# Patient Record
Sex: Male | Born: 1960 | Race: White | Hispanic: No | Marital: Single | Smoking: Current every day smoker
Health system: Southern US, Community
[De-identification: ages and names within clinical notes are randomized; demographics above are authoritative.]

## PROBLEM LIST (undated history)

## (undated) DIAGNOSIS — J439 Emphysema, unspecified: Secondary | ICD-10-CM

## (undated) HISTORY — PX: TONSILLECTOMY AND ADENOIDECTOMY: SHX28

---

## 2008-10-14 ENCOUNTER — Emergency Department: Payer: Self-pay | Admitting: Emergency Medicine

## 2009-10-08 ENCOUNTER — Emergency Department: Payer: Self-pay | Admitting: Internal Medicine

## 2011-01-05 ENCOUNTER — Emergency Department: Payer: Self-pay | Admitting: *Deleted

## 2013-02-17 DIAGNOSIS — D126 Benign neoplasm of colon, unspecified: Secondary | ICD-10-CM | POA: Insufficient documentation

## 2013-09-28 ENCOUNTER — Emergency Department: Payer: Self-pay | Admitting: Emergency Medicine

## 2013-09-28 LAB — CBC WITH DIFFERENTIAL/PLATELET
BASOS PCT: 0.4 %
Basophil #: 0 10*3/uL (ref 0.0–0.1)
Eosinophil #: 0.1 10*3/uL (ref 0.0–0.7)
Eosinophil %: 0.9 %
HCT: 41.9 % (ref 40.0–52.0)
HGB: 13.6 g/dL (ref 13.0–18.0)
LYMPHS PCT: 17.8 %
Lymphocyte #: 2.3 10*3/uL (ref 1.0–3.6)
MCH: 30 pg (ref 26.0–34.0)
MCHC: 32.6 g/dL (ref 32.0–36.0)
MCV: 92 fL (ref 80–100)
Monocyte #: 1.6 x10 3/mm — ABNORMAL HIGH (ref 0.2–1.0)
Monocyte %: 12.5 %
NEUTROS PCT: 68.4 %
Neutrophil #: 9 10*3/uL — ABNORMAL HIGH (ref 1.4–6.5)
Platelet: 294 10*3/uL (ref 150–440)
RBC: 4.55 10*6/uL (ref 4.40–5.90)
RDW: 13 % (ref 11.5–14.5)
WBC: 13.2 10*3/uL — AB (ref 3.8–10.6)

## 2013-09-28 LAB — TROPONIN I: Troponin-I: <0.02 ng/mL

## 2013-09-28 LAB — BASIC METABOLIC PANEL
ANION GAP: 7 (ref 7–16)
BUN: 16 mg/dL (ref 7–18)
CREATININE: 0.87 mg/dL (ref 0.60–1.30)
Calcium, Total: 8.7 mg/dL (ref 8.5–10.1)
Chloride: 107 mmol/L (ref 98–107)
Co2: 26 mmol/L (ref 21–32)
EGFR (African American): >60
EGFR (Non-African Amer.): >60
Glucose: 87 mg/dL (ref 65–99)
Osmolality: 280 (ref 275–301)
Potassium: 3.5 mmol/L (ref 3.5–5.1)
SODIUM: 140 mmol/L (ref 136–145)

## 2015-01-10 ENCOUNTER — Emergency Department: Payer: Self-pay

## 2015-01-10 ENCOUNTER — Emergency Department
Admission: EM | Admit: 2015-01-10 | Discharge: 2015-01-10 | Disposition: A | Discharge status: Home / Self Care | Payer: Self-pay | Attending: Emergency Medicine | Admitting: Emergency Medicine

## 2015-01-10 ENCOUNTER — Encounter: Payer: Self-pay | Admitting: Emergency Medicine

## 2015-01-10 DIAGNOSIS — Z720 Tobacco use: Secondary | ICD-10-CM

## 2015-01-10 DIAGNOSIS — J159 Unspecified bacterial pneumonia: Secondary | ICD-10-CM | POA: Insufficient documentation

## 2015-01-10 DIAGNOSIS — J189 Pneumonia, unspecified organism: Secondary | ICD-10-CM

## 2015-01-10 MED ORDER — DM-GUAIFENESIN ER 30-600 MG PO TB12: 1.0000 | ORAL_TABLET | Freq: Two times a day (BID) | ORAL | Status: DC

## 2015-01-10 MED ORDER — AZITHROMYCIN 250 MG PO TABS: ORAL_TABLET | ORAL | Status: AC

## 2015-01-10 NOTE — ED Notes (Signed)
States he has had sinus congestion and cough for about 1 week   Fever and chills.  States he is coughing up greenish sputum.

## 2015-01-10 NOTE — Discharge Instructions (Signed)
Smoking Cessation, Tips for Success If you are ready to quit smoking, congratulations! You have chosen to help yourself be healthier. Cigarettes bring nicotine, tar, carbon monoxide, and other irritants into your body. Your lungs, heart, and blood vessels will be able to work better without these poisons. There are many different ways to quit smoking. Nicotine gum, nicotine patches, a nicotine inhaler, or nicotine nasal spray can help with physical craving. Hypnosis, support groups, and medicines help break the habit of smoking. WHAT THINGS CAN I DO TO MAKE QUITTING EASIER?  Here are some tips to help you quit for good:  Pick a date when you will quit smoking completely. Tell all of your friends and family about your plan to quit on that date.  Do not try to slowly cut down on the number of cigarettes you are smoking. Pick a quit date and quit smoking completely starting on that day.  Throw away all cigarettes.   Clean and remove all ashtrays from your home, work, and car.  On a card, write down your reasons for quitting. Carry the card with you and read it when you get the urge to smoke.  Cleanse your body of nicotine. Drink enough water and fluids to keep your urine clear or pale yellow. Do this after quitting to flush the nicotine from your body.  Learn to predict your moods. Do not let a bad situation be your excuse to have a cigarette. Some situations in your life might tempt you into wanting a cigarette.  Never have "just one" cigarette. It leads to wanting another and another. Remind yourself of your decision to quit.  Change habits associated with smoking. If you smoked while driving or when feeling stressed, try other activities to replace smoking. Stand up when drinking your coffee. Brush your teeth after eating. Sit in a different chair when you read the paper. Avoid alcohol while trying to quit, and try to drink fewer caffeinated beverages. Alcohol and caffeine may urge you to  smoke.  Avoid foods and drinks that can trigger a desire to smoke, such as sugary or spicy foods and alcohol.  Ask people who smoke not to smoke around you.  Have something planned to do right after eating or having a cup of coffee. For example, plan to take a walk or exercise.  Try a relaxation exercise to calm you down and decrease your stress. Remember, you may be tense and nervous for the first 2 weeks after you quit, but this will pass.  Find new activities to keep your hands busy. Play with a pen, coin, or rubber band. Doodle or draw things on paper.  Brush your teeth right after eating. This will help cut down on the craving for the taste of tobacco after meals. You can also try mouthwash.   Use oral substitutes in place of cigarettes. Try using lemon drops, carrots, cinnamon sticks, or chewing gum. Keep them handy so they are available when you have the urge to smoke.  When you have the urge to smoke, try deep breathing.  Designate your home as a nonsmoking area.  If you are a heavy smoker, ask your health care provider about a prescription for nicotine chewing gum. It can ease your withdrawal from nicotine.  Reward yourself. Set aside the cigarette money you save and buy yourself something nice.  Look for support from others. Join a support group or smoking cessation program. Ask someone at home or at work to help you with your plan   to quit smoking.  Always ask yourself, "Do I need this cigarette or is this just a reflex?" Tell yourself, "Today, I choose not to smoke," or "I do not want to smoke." You are reminding yourself of your decision to quit.  Do not replace cigarette smoking with electronic cigarettes (commonly called e-cigarettes). The safety of e-cigarettes is unknown, and some may contain harmful chemicals.  If you relapse, do not give up! Plan ahead and think about what you will do the next time you get the urge to smoke. HOW WILL I FEEL WHEN I QUIT SMOKING? You  may have symptoms of withdrawal because your body is used to nicotine (the addictive substance in cigarettes). You may crave cigarettes, be irritable, feel very hungry, cough often, get headaches, or have difficulty concentrating. The withdrawal symptoms are only temporary. They are strongest when you first quit but will go away within 10-14 days. When withdrawal symptoms occur, stay in control. Think about your reasons for quitting. Remind yourself that these are signs that your body is healing and getting used to being without cigarettes. Remember that withdrawal symptoms are easier to treat than the major diseases that smoking can cause.  Even after the withdrawal is over, expect periodic urges to smoke. However, these cravings are generally short lived and will go away whether you smoke or not. Do not smoke! WHAT RESOURCES ARE AVAILABLE TO HELP ME QUIT SMOKING? Your health care provider can direct you to community resources or hospitals for support, which may include:  Group support.  Education.  Hypnosis.  Therapy.   This information is not intended to replace advice given to you by your health care provider. Make sure you discuss any questions you have with your health care provider.   Document Released: 11/17/2003 Document Revised: 03/11/2014 Document Reviewed: 08/06/2012 Elsevier Interactive Patient Education 2016 [employer]  

## 2015-01-10 NOTE — ED Notes (Signed)
C/o productive cough and sinus pressure with runny nose

## 2015-01-10 NOTE — ED Provider Notes (Signed)
CSN: [id number]     Arrival date & time 01/10/15  0810 History   First MD Initiated Contact with Patient 01/10/15 0820     Chief Complaint  Patient presents with  . Nasal Congestion  . Cough     HPI Comments: 54 year old male presents a complaining of sinus congestion and cough for the past week. Patient reports he has a cough productive of thick green phlegm. He smokes about a pack per day of cigarettes. He has had some mild difficulty breathing. He does not have a primary care provider. He has not taken any over-the-counter medicines. He has had positive sick contact at work. No fevers but has had chills and sweats.  The history is provided by the patient.    History reviewed. No pertinent past medical history. History reviewed. No pertinent past surgical history. No family history on file. Social History  Substance Use Topics  . Smoking status: Current Every Day Smoker    Types: Cigarettes  . Smokeless tobacco: None  . Alcohol Use: No    Review of Systems  Constitutional: Positive for chills. Negative for fever.  HENT: Positive for congestion.   Respiratory: Positive for cough and shortness of breath.   Musculoskeletal: Negative for myalgias and arthralgias.  Skin: Negative for rash.  All other systems reviewed and are negative.     Allergies  Review of patient's allergies indicates no known allergies.  Home Medications   Prior to Admission medications   Medication Sig Start Date End Date Taking? Authorizing Provider  azithromycin (ZITHROMAX Z-PAK) 250 MG tablet Take 2 tablets (500 mg) on  Day 1,  followed by 1 tablet (250 mg) once daily on Days 2 through 5. 01/10/15 01/15/15  Luvenia Redden, PA-C  dextromethorphan-guaiFENesin (MUCINEX DM) 30-600 MG 12hr tablet Take 1 tablet by mouth 2 (two) times daily. 01/10/15   Luvenia Redden, PA-C   BP 112/76 mmHg  Pulse 60  Temp(Src) 98 F (36.7 C) (Oral)  Resp 18  Ht  (1.778 m)  Wt 150 lb (68.04 kg)  BMI 21.52 kg/m2   SpO2 98% Physical Exam  Constitutional: He is oriented to person, place, and time. Vital signs are normal. He appears well-developed and well-nourished. He is active.  Non-toxic appearance. He does not have a sickly appearance. He does not appear ill.  HENT:  Head: Normocephalic and atraumatic.  Right Ear: Tympanic membrane and external ear normal.  Left Ear: Tympanic membrane and external ear normal.  Nose: Rhinorrhea present.  Mouth/Throat: Oropharynx is clear and moist.  Eyes: Conjunctivae and EOM are normal. Pupils are equal, round, and reactive to light.  Neck: Normal range of motion. Neck supple.  Cardiovascular: Normal rate, regular rhythm, normal heart sounds and intact distal pulses.  Exam reveals no gallop and no friction rub.   No murmur heard. Pulmonary/Chest: Effort normal and breath sounds normal. No respiratory distress. He has no wheezes. He has no rales.  Musculoskeletal: Normal range of motion.  Lymphadenopathy:    He has no cervical adenopathy.  Neurological: He is alert and oriented to person, place, and time.  Skin: Skin is warm and dry. No rash noted.  Psychiatric: He has a normal mood and affect. His behavior is normal. Judgment and thought content normal.  Nursing note and vitals reviewed.   ED Course  Procedures (including critical care time) Labs Review Labs Reviewed - No data to display  Imaging Review Dg Chest 2 View  01/10/2015  CLINICAL DATA:  One week  of productive cough and shortness of breath, current smoker. EXAM: CHEST  2 VIEW COMPARISON:  Portable chest x-ray of October 08, 2009 FINDINGS: The lungs are hyperinflated with hemidiaphragm flattening and increased AP dimension of the thorax. There is patchy density in the retrocardiac region on the left. There is subtle increased density in the retro hilar region on the lateral image which is difficult to triangulate on the frontal image. The heart and pulmonary vascularity and mediastinal structures are  normal. There is no pleural effusion or pneumothorax. The bony thorax is unremarkable. IMPRESSION: COPD. Patchy interstitial or early alveolar pneumonia in the left lower lobe posteriorly. Subtle increased density in the posterior perihilar region on the lateral image only may reflect atelectasis or pneumonia as well. Followup PA and lateral chest X-ray is recommended in 3-4 weeks following trial of antibiotic therapy to ensure resolution and exclude underlying malignancy. Electronically Signed   By: David  SwazilandJordan M.D.   On: 01/10/2015 08:49   I have personally reviewed and evaluated these images and lab results as part of my medical decision-making.   EKG Interpretation None      MDM  CXR to eval for PNA   Treat with Zpak for PNA, mucinex dm for cough, strongly encouraged smoking cessation  Advised to follow up in 4 weeks for repeat CXR  Final diagnoses:  Tobacco abuse  Community acquired pneumonia        Luvenia Reddenmma Weavil V, PA-C 01/10/15 0851  Wilber OliphantEmma Weavil V, PA-C 01/10/15 1004  Jeanmarie PlantJames A McShane, MD 01/10/15 1257

## 2015-04-03 ENCOUNTER — Emergency Department: Payer: Self-pay

## 2015-04-03 ENCOUNTER — Emergency Department
Admission: EM | Admit: 2015-04-03 | Discharge: 2015-04-03 | Disposition: A | Discharge status: Home / Self Care | Payer: Self-pay | Attending: Emergency Medicine | Admitting: Emergency Medicine

## 2015-04-03 ENCOUNTER — Encounter: Payer: Self-pay | Admitting: Emergency Medicine

## 2015-04-03 DIAGNOSIS — J40 Bronchitis, not specified as acute or chronic: Secondary | ICD-10-CM | POA: Insufficient documentation

## 2015-04-03 DIAGNOSIS — Z79899 Other long term (current) drug therapy: Secondary | ICD-10-CM | POA: Insufficient documentation

## 2015-04-03 DIAGNOSIS — F17210 Nicotine dependence, cigarettes, uncomplicated: Secondary | ICD-10-CM | POA: Insufficient documentation

## 2015-04-03 MED ORDER — ALBUTEROL SULFATE HFA 108 (90 BASE) MCG/ACT IN AERS: 2.0000 | INHALATION_SPRAY | Freq: Four times a day (QID) | RESPIRATORY_TRACT | Status: DC | PRN

## 2015-04-03 MED ORDER — PREDNISONE 20 MG PO TABS: 40.0000 mg | ORAL_TABLET | Freq: Every day | ORAL | Status: DC

## 2015-04-03 NOTE — ED Notes (Signed)
Pt ambulatory to triage by self, CO shortness of breath, worse with exertion, pt reports recent (last Nov) dx of PNX.  Received steriod treatment for that.  Pt reports that last week pt was exposed dust of cement nature last and this caused the pt ailment.  Pain worse with deep inhalation.  Pt right lower lung wheeze heard, decreased air movement heard.

## 2015-04-03 NOTE — ED Notes (Addendum)
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

## 2015-04-03 NOTE — ED Provider Notes (Signed)
[facility] Emergency Department Provider Note   ____________________________________________  Time seen: 1925  I have reviewed the triage vital signs and the nursing notes.   HISTORY  Chief Complaint Shortness of Breath   History limited by: Not Limited   HPI Philip Rice is a 55 y.o. male with history of smoking who presents to the emergency department today because of concern for shortness of breath, cough and some chest tightness. The patient states that these symptoms started roughly one week ago. They have been constant and gradually getting worse. He thinks that they were triggered by working around concrete dust. He feels some central chest tightness with deep breaths. Has been coughing up greenish phlegm. Denies any fevers. Denies any vomiting or diarrhea.   Past Medical History  Diagnosis Date  . Patient denies medical problems     There are no active problems to display for this patient.   Past Surgical History  Procedure Laterality Date  . Tonsillectomy and adenoidectomy      Current Outpatient Rx  Name  Route  Sig  Dispense  Refill  . dextromethorphan-guaiFENesin (MUCINEX DM) 30-600 MG 12hr tablet   Oral   Take 1 tablet by mouth 2 (two) times daily.   30 tablet   o     Allergies Review of patient's allergies indicates no known allergies.  History reviewed. No pertinent family history.  Social History Social History  Substance Use Topics  . Smoking status: Current Every Day Smoker -- 1.00 packs/day    Types: Cigarettes  . Smokeless tobacco: None  . Alcohol Use: No    Review of Systems  Constitutional: Negative for fever. Cardiovascular: Negative for chest pain. Positive for chest tightness. Respiratory: Positive for cough, shortness of breath. Gastrointestinal: Negative for abdominal pain, vomiting and diarrhea. Neurological: Negative for headaches, focal weakness or numbness.  10-point ROS otherwise  negative.  ____________________________________________   PHYSICAL EXAM:  VITAL SIGNS: ED Triage Vitals  Enc Vitals Group     BP 04/03/15 0549 102/77 mmHg     Pulse Rate 04/03/15 0549 75     Resp 04/03/15 0549 19     Temp 04/03/15 0549 97.5 F (36.4 C)     Temp Source 04/03/15 0549 Oral     SpO2 04/03/15 0549 99 %     Weight 04/03/15 0549 150 lb (68.04 kg)     Height 04/03/15 0549  (1.778 m)     Head Cir --      Peak Flow --      Pain Score 04/03/15 0607 4   Constitutional: Alert and oriented. Well appearing and in no distress. Eyes: Conjunctivae are normal. PERRL. Normal extraocular movements. ENT   Head: Normocephalic and atraumatic.   Nose: No congestion/rhinnorhea.   Mouth/Throat: Mucous membranes are moist.   Neck: No stridor. Hematological/Lymphatic/Immunilogical: No cervical lymphadenopathy. Cardiovascular: Normal rate, regular rhythm.  No murmurs, rubs, or gallops. Respiratory: Normal respiratory effort without tachypnea nor retractions. No wheezes, rhonchi or rales. Slightly prolonged expiratory phase. Gastrointestinal: Soft and nontender. No distention. There is no CVA tenderness. Genitourinary: Deferred Musculoskeletal: Normal range of motion in all extremities. No joint effusions.  No lower extremity tenderness nor edema. Neurologic:  Normal speech and language. No gross focal neurologic deficits are appreciated.  Skin:  Skin is warm, dry and intact. No rash noted. Psychiatric: Mood and affect are normal. Speech and behavior are normal. Patient exhibits appropriate insight and judgment.  ____________________________________________    LABS (pertinent positives/negatives)  None  ____________________________________________   EKG  I, Phineas Semen, attending physician, personally viewed and interpreted this EKG  EKG Time: 0617 Rate: 66 Rhythm: normal sinus rhythm Axis: normal Intervals: qtc 429 QRS: narrow ST changes: no st  elevation Impression: normal ekg ____________________________________________    RADIOLOGY  CXR IMPRESSION: 1. Hyperexpanded lungs indicating COPD. Suspect associated chronic bronchitic changes centrally. 2. No acute findings. No evidence of pneumonia on today's exam. Previously noted retrocardiac pneumonia has resolved.  I, Rema Lievanos, personally viewed and evaluated these images (plain radiographs) as part of my medical decision making. ____________________________________________   PROCEDURES  Procedure(s) performed: None  Critical Care performed: No  ____________________________________________   INITIAL IMPRESSION / ASSESSMENT AND PLAN / ED COURSE  Pertinent labs & imaging results that were available during my care of the patient were reviewed by me and considered in my medical decision making (see chart for details).  Patient presented to the ER today because of concern for cough, shortness of breath and some chest tightness. Physical exam is notable for slightly prolonged expiratory phase otherwise benign. CXR and EKG without concerning findings. Patient is a smoker. Think likely patient suffering from bronchitis. Doubt PE/ACS given clinical history. Will give prescription for steroids, inhaler and PCP follow up.  ____________________________________________   FINAL CLINICAL IMPRESSION(S) / ED DIAGNOSES  Final diagnoses:  Bronchitis     Phineas Semen, MD 04/03/15 [phone]

## 2015-04-03 NOTE — ED Notes (Signed)
Pt ambulatory to xray accomp by xray tech

## 2015-04-03 NOTE — Discharge Instructions (Signed)
Please seek medical attention for any high fevers, chest pain, shortness of breath, change in behavior, persistent vomiting, bloody stool or any other new or concerning symptoms. ° °Acute Bronchitis °Bronchitis is inflammation of the airways that extend from the windpipe into the lungs (bronchi). The inflammation often causes mucus to develop. This leads to a cough, which is the most common symptom of bronchitis.  °In acute bronchitis, the condition usually develops suddenly and goes away over time, usually in a couple weeks. Smoking, allergies, and asthma can make bronchitis worse. Repeated episodes of bronchitis may cause further lung problems.  °CAUSES °Acute bronchitis is most often caused by the same virus that causes a cold. The virus can spread from person to person (contagious) through coughing, sneezing, and touching contaminated objects. °SIGNS AND SYMPTOMS  °· Cough.   °· Fever.   °· Coughing up mucus.   °· Body aches.   °· Chest congestion.   °· Chills.   °· Shortness of breath.   °· Sore throat.   °DIAGNOSIS  °Acute bronchitis is usually diagnosed through a physical exam. Your health care provider will also ask you questions about your medical history. Tests, such as chest X-rays, are sometimes done to rule out other conditions.  °TREATMENT  °Acute bronchitis usually goes away in a couple weeks. Oftentimes, no medical treatment is necessary. Medicines are sometimes given for relief of fever or cough. Antibiotic medicines are usually not needed but may be prescribed in certain situations. In some cases, an inhaler may be recommended to help reduce shortness of breath and control the cough. A cool mist vaporizer may also be used to help thin bronchial secretions and make it easier to clear the chest.  °HOME CARE INSTRUCTIONS °· Get plenty of rest.   °· Drink enough fluids to keep your urine clear or pale yellow (unless you have a medical condition that requires fluid restriction). Increasing fluids may  help thin your respiratory secretions (sputum) and reduce chest congestion, and it will prevent dehydration.   °· Take medicines only as directed by your health care provider. °· If you were prescribed an antibiotic medicine, finish it all even if you start to feel better. °· Avoid smoking and secondhand smoke. Exposure to cigarette smoke or irritating chemicals will make bronchitis worse. If you are a smoker, consider using nicotine gum or skin patches to help control withdrawal symptoms. Quitting smoking will help your lungs heal faster.   °· Reduce the chances of another bout of acute bronchitis by washing your hands frequently, avoiding people with cold symptoms, and trying not to touch your hands to your mouth, nose, or eyes.   °· Keep all follow-up visits as directed by your health care provider.   °SEEK MEDICAL CARE IF: °Your symptoms do not improve after 1 week of treatment.  °SEEK IMMEDIATE MEDICAL CARE IF: °· You develop an increased fever or chills.   °· You have chest pain.   °· You have severe shortness of breath. °· You have bloody sputum.   °· You develop dehydration. °· You faint or repeatedly feel like you are going to pass out. °· You develop repeated vomiting. °· You develop a severe headache. °MAKE SURE YOU:  °· Understand these instructions. °· Will watch your condition. °· Will get help right away if you are not doing well or get worse. °  °This information is not intended to replace advice given to you by your health care provider. Make sure you discuss any questions you have with your health care provider. °  °Document Released: 03/28/2004 Document Revised: 03/11/2014 Document   Reviewed: 08/11/2012 °Elsevier Interactive Patient Education ©2016 [employer] Inc. ° °

## 2015-04-10 ENCOUNTER — Emergency Department: Admission: EM | Admit: 2015-04-10 | Discharge: 2015-04-10 | Discharge status: Absent without leave | Payer: Self-pay

## 2015-04-10 NOTE — ED Notes (Signed)
Called several times from lobby, outside, and restroom with no answer

## 2015-04-10 NOTE — ED Notes (Signed)
No answer when called several times from lobby, outside, and restroom 

## 2015-04-17 ENCOUNTER — Emergency Department: Payer: Self-pay

## 2015-04-17 ENCOUNTER — Emergency Department
Admission: EM | Admit: 2015-04-17 | Discharge: 2015-04-17 | Disposition: A | Discharge status: Home / Self Care | Payer: Self-pay | Attending: Emergency Medicine | Admitting: Emergency Medicine

## 2015-04-17 ENCOUNTER — Encounter: Payer: Self-pay | Admitting: Emergency Medicine

## 2015-04-17 DIAGNOSIS — Z79899 Other long term (current) drug therapy: Secondary | ICD-10-CM | POA: Insufficient documentation

## 2015-04-17 DIAGNOSIS — Z7952 Long term (current) use of systemic steroids: Secondary | ICD-10-CM | POA: Insufficient documentation

## 2015-04-17 DIAGNOSIS — G8929 Other chronic pain: Secondary | ICD-10-CM | POA: Insufficient documentation

## 2015-04-17 DIAGNOSIS — R0602 Shortness of breath: Secondary | ICD-10-CM | POA: Insufficient documentation

## 2015-04-17 DIAGNOSIS — M25551 Pain in right hip: Secondary | ICD-10-CM | POA: Insufficient documentation

## 2015-04-17 DIAGNOSIS — F17210 Nicotine dependence, cigarettes, uncomplicated: Secondary | ICD-10-CM | POA: Insufficient documentation

## 2015-04-17 HISTORY — DX: Emphysema, unspecified: J43.9

## 2015-04-17 LAB — BASIC METABOLIC PANEL
Anion gap: 4 — ABNORMAL LOW (ref 5–15)
BUN: 17 mg/dL (ref 6–20)
CHLORIDE: 107 mmol/L (ref 101–111)
CO2: 27 mmol/L (ref 22–32)
Calcium: 8.7 mg/dL — ABNORMAL LOW (ref 8.9–10.3)
Creatinine, Ser: 0.87 mg/dL (ref 0.61–1.24)
GFR calc non Af Amer: >60 mL/min (ref >60)
GLUCOSE: 103 mg/dL — AB (ref 65–99)
POTASSIUM: 4 mmol/L (ref 3.5–5.1)
SODIUM: 138 mmol/L (ref 135–145)

## 2015-04-17 LAB — URINALYSIS COMPLETE WITH MICROSCOPIC (ARMC ONLY)
BACTERIA UA: NONE SEEN
Bilirubin Urine: NEGATIVE
GLUCOSE, UA: NEGATIVE mg/dL
HGB URINE DIPSTICK: NEGATIVE
Ketones, ur: NEGATIVE mg/dL
LEUKOCYTES UA: NEGATIVE
Nitrite: NEGATIVE
Protein, ur: NEGATIVE mg/dL
Specific Gravity, Urine: 1.013 (ref 1.005–1.030)
pH: 6 (ref 5.0–8.0)

## 2015-04-17 LAB — CBC
HEMATOCRIT: 39.2 % — AB (ref 40.0–52.0)
Hemoglobin: 13.5 g/dL (ref 13.0–18.0)
MCH: 30.9 pg (ref 26.0–34.0)
MCHC: 34.5 g/dL (ref 32.0–36.0)
MCV: 89.4 fL (ref 80.0–100.0)
Platelets: 311 10*3/uL (ref 150–440)
RBC: 4.39 MIL/uL — ABNORMAL LOW (ref 4.40–5.90)
RDW: 13.2 % (ref 11.5–14.5)
WBC: 10.4 10*3/uL (ref 3.8–10.6)

## 2015-04-17 MED ORDER — ALBUTEROL SULFATE HFA 108 (90 BASE) MCG/ACT IN AERS: 2.0000 | INHALATION_SPRAY | Freq: Four times a day (QID) | RESPIRATORY_TRACT | Status: DC | PRN

## 2015-04-17 MED ORDER — IPRATROPIUM-ALBUTEROL 0.5-2.5 (3) MG/3ML IN SOLN
3.0000 mL | Freq: Once | RESPIRATORY_TRACT | Status: AC
  Administered 2015-04-17: 3 mL via RESPIRATORY_TRACT
  Filled 2015-04-17: qty 3

## 2015-04-17 NOTE — ED Notes (Signed)
Patient states that he just feels like he has no energy and has felt tired over the last week.

## 2015-04-17 NOTE — ED Provider Notes (Signed)
[facility] Emergency Department Provider Note  ____________________________________________    I have reviewed the triage vital signs and the nursing notes.   HISTORY  Chief Complaint Fatigue    HPI Philip Rice is a 55 y.o. male who presents with complaints of "low energy ". 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 Apparently he was diagnosed and treated for pneumonia 3-4 weeks ago and has felt somewhat better but continues to feel that his energy level is not normal. He denies fevers chills. He denies cough. No myalgias. No rash.     Past Medical History  Diagnosis Date  . Patient denies medical problems     There are no active problems to display for this patient.   Past Surgical History  Procedure Laterality Date  . Tonsillectomy and adenoidectomy      Current Outpatient Rx  Name  Route  Sig  Dispense  Refill  . albuterol (PROVENTIL HFA;VENTOLIN HFA) 108 (90 Base) MCG/ACT inhaler   Inhalation   Inhale 2 puffs into the lungs every 6 (six) hours as needed for wheezing or shortness of breath.   1 Inhaler   0   . dextromethorphan-guaiFENesin (MUCINEX DM) 30-600 MG 12hr tablet   Oral   Take 1 tablet by mouth 2 (two) times daily.   30 tablet   o   . predniSONE (DELTASONE) 20 MG tablet   Oral   Take 2 tablets (40 mg total) by mouth daily.   10 tablet   0     Allergies Review of patient's allergies indicates no known allergies.  No family history on file.  Social History Social History  Substance Use Topics  . Smoking status: Current Every Day Smoker -- 0.50 packs/day    Types: Cigarettes  . Smokeless tobacco: None  . Alcohol Use: No    Review of Systems  Constitutional: Negative for fever. Eyes: Negative for visual changes. ENT: Negative for sore throat Cardiovascular: Negative for chest pain. Respiratory: Negative for shortness of  breath. No cough Gastrointestinal: Negative for abdominal pain, vomiting and diarrhea. Genitourinary: Negative for dysuria. Musculoskeletal: Negative for back pain. Right hip aching, chronic Skin: Negative for rash. Neurological: Negative for headaches  Psychiatric: Denies depression or anxiety    ____________________________________________   PHYSICAL EXAM:  VITAL SIGNS: ED Triage Vitals  Enc Vitals Group     BP 04/17/15 0655 118/69 mmHg     Pulse Rate 04/17/15 0654 77     Resp 04/17/15 0654 18     Temp 04/17/15 0654 97.9 F (36.6 C)     Temp Source 04/17/15 0654 Oral     SpO2 04/17/15 0654 98 %     Weight --      Height 04/17/15 0654  (1.778 m)     Head Cir --      Peak Flow --      Pain Score --      Pain Loc --      Pain Edu? --      Excl. in GC? --      Constitutional: Alert and oriented. Well appearing and in no distress. Eyes: Conjunctivae are normal.  ENT   Head: Normocephalic and atraumatic.   Mouth/Throat: Mucous membranes are moist. Cardiovascular: Normal rate, regular rhythm. Normal and symmetric distal pulses are present in all extremities. No murmurs, rubs, or gallops. Respiratory: Normal respiratory effort without tachypnea nor  retractions. Breath sounds are clear and equal bilaterally.  Gastrointestinal: Soft and non-tender in all quadrants. No distention. There is no CVA tenderness. Genitourinary: deferred Musculoskeletal: Nontender with normal range of motion in all extremities. No lower extremity tenderness nor edema. Neurologic:  Normal speech and language. No gross focal neurologic deficits are appreciated. Skin:  Skin is warm, dry and intact. No rash noted. Psychiatric: Mood and affect are normal. Patient exhibits appropriate insight and judgment.  ____________________________________________    LABS (pertinent positives/negatives)  Labs Reviewed  BASIC METABOLIC PANEL - Abnormal; Notable for the following:    Glucose, Bld 103  (*)    Calcium 8.7 (*)    Anion gap 4 (*)    All other components within normal limits  CBC - Abnormal; Notable for the following:    RBC 4.39 (*)    HCT 39.2 (*)    All other components within normal limits  URINALYSIS COMPLETEWITH MICROSCOPIC ([facility] ONLY) - Abnormal; Notable for the following:    Color, Urine YELLOW (*)    APPearance CLEAR (*)    Squamous Epithelial / LPF 0-5 (*)    All other components within normal limits    ____________________________________________   EKG  ED ECG REPORT I, Jene Every, the attending physician, personally viewed and interpreted this ECG.  Date: 04/17/2015 EKG Time: 7:04 AM Rate: 64 Rhythm: normal sinus rhythm QRS Axis: normal Intervals: normal ST/T Wave abnormalities: normal Conduction Disturbances: none Narrative Interpretation: unremarkable   ____________________________________________    RADIOLOGY I have personally reviewed any xrays that were ordered on this patient: Chest x-ray unremarkable  ____________________________________________   PROCEDURES  Procedure(s) performed: none  Critical Care performed: none  ____________________________________________   INITIAL IMPRESSION / ASSESSMENT AND PLAN / ED COURSE  Pertinent labs & imaging results that were available during my care of the patient were reviewed by me and considered in my medical decision making (see chart for details).  Patient well-appearing and in no distress. His vital signs are unremarkable. His lab work is benign. His EKG is normal. He denies depression. Unclear cause for his fatigue. We will recheck chest x-ray to ensure clearing of pneumonia the patient is well-appearing and outpatient follow-up would be appropriate.  Chest x-ray is unremarkable. Patient does report some improvement after DuoNeb. I will write a prescription for albuterol inhaler. Recommend PCP follow-up  ____________________________________________   FINAL CLINICAL  IMPRESSION(S) / ED DIAGNOSES  Final diagnoses:  Shortness of breath     Jene Every, MD 04/17/15 1419

## 2015-04-17 NOTE — Discharge Instructions (Signed)

## 2016-01-08 ENCOUNTER — Emergency Department
Admission: EM | Admit: 2016-01-08 | Discharge: 2016-01-08 | Disposition: A | Discharge status: Home / Self Care | Payer: Self-pay | Attending: Emergency Medicine | Admitting: Emergency Medicine

## 2016-01-08 ENCOUNTER — Emergency Department: Payer: Self-pay

## 2016-01-08 DIAGNOSIS — J069 Acute upper respiratory infection, unspecified: Secondary | ICD-10-CM

## 2016-01-08 DIAGNOSIS — F17210 Nicotine dependence, cigarettes, uncomplicated: Secondary | ICD-10-CM | POA: Insufficient documentation

## 2016-01-08 LAB — CBC WITH DIFFERENTIAL/PLATELET
BASOS ABS: 0.1 10*3/uL (ref 0–0.1)
BASOS PCT: 1 %
EOS ABS: 0.6 10*3/uL (ref 0–0.7)
EOS PCT: 6 %
HCT: 43.9 % (ref 40.0–52.0)
HEMOGLOBIN: 15.1 g/dL (ref 13.0–18.0)
Lymphocytes Relative: 9 %
Lymphs Abs: 0.8 10*3/uL — ABNORMAL LOW (ref 1.0–3.6)
MCH: 31.6 pg (ref 26.0–34.0)
MCHC: 34.4 g/dL (ref 32.0–36.0)
MCV: 91.8 fL (ref 80.0–100.0)
Monocytes Absolute: 1.2 10*3/uL — ABNORMAL HIGH (ref 0.2–1.0)
Monocytes Relative: 12 %
NEUTROS PCT: 72 %
Neutro Abs: 7 10*3/uL — ABNORMAL HIGH (ref 1.4–6.5)
PLATELETS: 225 10*3/uL (ref 150–440)
RBC: 4.78 MIL/uL (ref 4.40–5.90)
RDW: 13.2 % (ref 11.5–14.5)
WBC: 9.7 10*3/uL (ref 3.8–10.6)

## 2016-01-08 LAB — COMPREHENSIVE METABOLIC PANEL
ALBUMIN: 4.1 g/dL (ref 3.5–5.0)
ALT: 13 U/L — AB (ref 17–63)
AST: 18 U/L (ref 15–41)
Alkaline Phosphatase: 60 U/L (ref 38–126)
Anion gap: 7 (ref 5–15)
BUN: 10 mg/dL (ref 6–20)
CHLORIDE: 105 mmol/L (ref 101–111)
CO2: 27 mmol/L (ref 22–32)
CREATININE: 1.02 mg/dL (ref 0.61–1.24)
Calcium: 9.1 mg/dL (ref 8.9–10.3)
GFR calc non Af Amer: >60 mL/min (ref >60)
GLUCOSE: 99 mg/dL (ref 65–99)
Potassium: 4 mmol/L (ref 3.5–5.1)
SODIUM: 139 mmol/L (ref 135–145)
Total Bilirubin: 0.3 mg/dL (ref 0.3–1.2)
Total Protein: 6.8 g/dL (ref 6.5–8.1)

## 2016-01-08 LAB — TROPONIN I

## 2016-01-08 MED ORDER — IPRATROPIUM-ALBUTEROL 0.5-2.5 (3) MG/3ML IN SOLN
3.0000 mL | Freq: Once | RESPIRATORY_TRACT | Status: AC
  Administered 2016-01-08: 3 mL via RESPIRATORY_TRACT
  Filled 2016-01-08: qty 3

## 2016-01-08 NOTE — ED Notes (Signed)
Patient transported to X-ray 

## 2016-01-08 NOTE — ED Notes (Signed)
Pt assisted to wheelchair upon arrival; c/o pain to the center of his chest; concerned he has pneumonia again; taken to treatment room 6 by ED tech Waynetta SandyBeth; verbal report given to [place]enna, [place]

## 2016-01-08 NOTE — ED Provider Notes (Signed)
[facility] Emergency Department Provider Note   ____________________________________________    I have reviewed the triage vital signs and the nursing notes.   HISTORY  Chief Complaint Cough, joint aches, chest tightness, malaise    HPI Philip SharpsJames E Rice is a [age] y.o. male who presents with cough, fatigue, malaise, joint aches, mild chest tightness. He reports he started last week. Does complain of mild shortness of breath he has a history of emphysema, he continues to smoke half pack of cigars per day. He denies fevers. He did not get a flu shot this year. No pleurisy, productive cough, no calf pain or swelling. He states his symptoms are similar to when he had walking pneumonia last year.   Past Medical History:  Diagnosis Date  . Emphysema lung (HCC)    patient denies  . Patient denies medical problems     There are no active problems to display for this patient.   Past Surgical History:  Procedure Laterality Date  . TONSILLECTOMY AND ADENOIDECTOMY      Prior to Admission medications   Not on File     Allergies Patient has no known allergies.  No family history on file.  Social History Social History  Substance Use Topics  . Smoking status: Current Every Day Smoker    Packs/day: 0.50    Types: Cigarettes  . Smokeless tobacco: Former Philip Rice  . Alcohol use No    Review of Systems  Constitutional: No fever/chills, Positive fatigue Eyes: No visual changes.  ENT: No sore throat. Cardiovascular: Mild chest tightness Respiratory: Denies shortness of breath. Positive productive cough Gastrointestinal: No abdominal pain.  No nausea, no vomiting.    Musculoskeletal: Positive for joint aches, diffusely Skin: Negative for rash. Neurological: Negative for headaches   10-point ROS otherwise negative.  ____________________________________________   PHYSICAL EXAM:  VITAL SIGNS: ED Triage Vitals  Enc Vitals Group     BP 01/08/16  0627 115/74     Pulse Rate 01/08/16 0627 82     Resp 01/08/16 0627 14     Temp 01/08/16 0627 98.4 F (36.9 C)     Temp Source 01/08/16 0627 Oral     SpO2 01/08/16 0629 98 %     Weight 01/08/16 0629 150 lb (68 kg)     Height 01/08/16 0629 5\' 10"  (1.778 m)     Head Circumference --      Peak Flow --      Pain Score 01/08/16 0629 6     Pain Loc --      Pain Edu? --      Excl. in GC? --     Constitutional: Alert and oriented. No acute distress. Pleasant and interactive Eyes: Conjunctivae are normal.   Nose: No congestion/rhinnorhea. Mouth/Throat: Mucous membranes are moist.   Neck:  Painless ROM Cardiovascular: Normal rate, regular rhythm. Grossly normal heart sounds.  Good peripheral circulation. Respiratory: Normal respiratory effort.  No retractions. Scattered mild wheezes Gastrointestinal: Soft and nontender. No distention.  No CVA tenderness. Genitourinary: deferred Musculoskeletal: No lower extremity tenderness nor edema.  Warm and well perfused Neurologic:  Normal speech and language. No gross focal neurologic deficits are appreciated.  Skin:  Skin is warm, dry and intact. No rash noted. Psychiatric: Mood and affect are normal. Speech and behavior are normal.  ____________________________________________   LABS (all labs ordered are listed, but only abnormal results are displayed)  Labs Reviewed  CBC WITH DIFFERENTIAL/PLATELET - Abnormal; Notable for the following:  Result Value   Neutro Abs 7.0 (*)    Lymphs Abs 0.8 (*)    Monocytes Absolute 1.2 (*)    All other components within normal limits  TROPONIN I  COMPREHENSIVE METABOLIC PANEL   ____________________________________________  EKG  ED ECG REPORT I, Philip EveryKINNER, Caine Barfield, the attending physician, personally viewed and interpreted this ECG.  Date: 01/08/2016 EKG Time: 6:20 AM Rate: 77 Rhythm: normal sinus rhythm QRS Axis: normal Intervals: normal ST/T Wave abnormalities: normal Conduction  Disturbances: none Narrative Interpretation: unremarkable  ____________________________________________  RADIOLOGY  Chest x-ray unremarkable ____________________________________________   PROCEDURES  Procedure(s) performed: No    Critical Care performed: No ____________________________________________   INITIAL IMPRESSION / ASSESSMENT AND PLAN / ED COURSE  Pertinent labs & imaging results that were available during my care of the patient were reviewed by me and considered in my medical decision making (see chart for details).  Patient well-appearing and in no distress. Exam is overall benign aside some scattered wheezes bibasilarly. Chest x-ray is unremarkable, labs pending. EKG is normal. We will treat with DuoNeb and reevaluate after labs have returned.  Clinical Course as of Jan 08 1199  Mon Jan 08, 2016  0715 Troponin I: <0.03 [RK]  0715 DG Chest 2 View [RK]    Clinical Course User Index [RK] Philip Everyobert Devontaye Ground, MD  Su Hiltoberts unremarkable, chest x-ray is clear, patient feels better after DuoNeb. Most consistent with upper respiratory infection, likely viral. Follow up with PCP or return if worsening. ____________________________________________   FINAL CLINICAL IMPRESSION(S) / ED DIAGNOSES  Final diagnoses:  Viral upper respiratory tract infection      NEW MEDICATIONS STARTED DURING THIS VISIT:  New Prescriptions   No medications on file     Note:  This document was prepared using Dragon voice recognition software and may include unintentional dictation errors.    Philip Everyobert Dhani Imel, MD 01/08/16 1201

## 2016-01-08 NOTE — ED Triage Notes (Signed)
Pt c/o chest pain described as tightness that radiates up right neck. Pt has accompanying symptoms of SOB, weakness.  Pt reports the pain is similar to when he had pneumonia last year. Pt c/o joint pain, cough, sinus congestion.

## 2016-04-26 ENCOUNTER — Emergency Department
Admission: EM | Admit: 2016-04-26 | Discharge: 2016-04-26 | Disposition: A | Discharge status: Home / Self Care | Payer: No Typology Code available for payment source | Attending: Emergency Medicine | Admitting: Emergency Medicine

## 2016-04-26 ENCOUNTER — Encounter: Payer: Self-pay | Admitting: Emergency Medicine

## 2016-04-26 DIAGNOSIS — R5383 Other fatigue: Secondary | ICD-10-CM

## 2016-04-26 DIAGNOSIS — T50901A Poisoning by unspecified drugs, medicaments and biological substances, accidental (unintentional), initial encounter: Secondary | ICD-10-CM | POA: Insufficient documentation

## 2016-04-26 DIAGNOSIS — F17210 Nicotine dependence, cigarettes, uncomplicated: Secondary | ICD-10-CM | POA: Diagnosis not present

## 2016-04-26 DIAGNOSIS — E860 Dehydration: Secondary | ICD-10-CM | POA: Diagnosis not present

## 2016-04-26 DIAGNOSIS — T6591XA Toxic effect of unspecified substance, accidental (unintentional), initial encounter: Secondary | ICD-10-CM

## 2016-04-26 LAB — BASIC METABOLIC PANEL
ANION GAP: 6 (ref 5–15)
BUN: 16 mg/dL (ref 6–20)
CHLORIDE: 109 mmol/L (ref 101–111)
CO2: 25 mmol/L (ref 22–32)
Calcium: 9.4 mg/dL (ref 8.9–10.3)
Creatinine, Ser: 0.88 mg/dL (ref 0.61–1.24)
GFR calc Af Amer: >60 mL/min (ref >60)
GLUCOSE: 87 mg/dL (ref 65–99)
POTASSIUM: 4 mmol/L (ref 3.5–5.1)
Sodium: 140 mmol/L (ref 135–145)

## 2016-04-26 LAB — URINALYSIS, COMPLETE (UACMP) WITH MICROSCOPIC
Bacteria, UA: NONE SEEN
Bilirubin Urine: NEGATIVE
GLUCOSE, UA: NEGATIVE mg/dL
HGB URINE DIPSTICK: NEGATIVE
Ketones, ur: 5 mg/dL — AB
Leukocytes, UA: NEGATIVE
NITRITE: NEGATIVE
PROTEIN: NEGATIVE mg/dL
RBC / HPF: NONE SEEN RBC/hpf (ref 0–5)
Specific Gravity, Urine: 1.021 (ref 1.005–1.030)
Squamous Epithelial / LPF: NONE SEEN
pH: 5 (ref 5.0–8.0)

## 2016-04-26 NOTE — ED Provider Notes (Signed)
[facility] Emergency Department Provider Note  ____________________________________________  Time seen: Approximately 7:28 AM  I have reviewed the triage vital signs and the nursing notes.   HISTORY  Chief Complaint Ingestion    HPI Philip Rice is a 56 y.o. male who complains of antifreeze exposure yesterday afternoon. He reports that he was working on his car when the radiator cap came loose, spraying the radiator fluid up into the air. He was wearing safety goggles which protected his eyes from any exposure, but he had his mouth open and reports that the liquid flew into his mouth filling and upping forcing him to swallow a mouth full. Denies eye pain. Denies any facial burn from this. No throat swelling or shortness of breath or pain currently. He is concerned about possible toxicity because he feels fatigued today. He does note that multiple of his coworkers have been sick with the flu recently. He also notes that his radiator contains about a 50-50 combination of antifreeze and water.  No change in balance or coordination. No headaches.     Past Medical History:  Diagnosis Date  . Emphysema lung (HCC)    patient denies     There are no active problems to display for this patient.    Past Surgical History:  Procedure Laterality Date  . TONSILLECTOMY AND ADENOIDECTOMY       Prior to Admission medications   Not on File     Allergies Patient has no known allergies.   History reviewed. No pertinent family history.  Social History Social History  Substance Use Topics  . Smoking status: Current Every Day Smoker    Packs/day: 0.50    Types: Cigarettes  . Smokeless tobacco: Former NeurosurgeonUser  . Alcohol use No    Review of Systems  Constitutional:   No fever or chills. Positive fatigue ENT:   No sore throat. No rhinorrhea. Cardiovascular:   No chest pain. Respiratory:   No dyspnea or cough. Gastrointestinal:   Negative for  abdominal pain, vomiting and diarrhea.  Genitourinary:   Negative for dysuria or difficulty urinating. Musculoskeletal:   Negative for focal pain or swelling Neurological:   Negative for headaches 10-point ROS otherwise negative.  ____________________________________________   PHYSICAL EXAM:  VITAL SIGNS: ED Triage Vitals  Enc Vitals Group     BP 04/26/16 0640 (!) 142/99     Pulse Rate 04/26/16 0640 63     Resp 04/26/16 0640 16     Temp 04/26/16 0640 98.1 F (36.7 C)     Temp Source 04/26/16 0640 Oral     SpO2 04/26/16 0640 100 %     Weight 04/26/16 0640 150 lb (68 kg)     Height 04/26/16 0640 5\' 10"  (1.778 m)     Head Circumference --      Peak Flow --      Pain Score 04/26/16 0641 2     Pain Loc --      Pain Edu? --      Excl. in GC? --     Vital signs reviewed, nursing assessments reviewed.   Constitutional:   Alert and oriented. Well appearing and in no distress. Eyes:   No scleral icterus. No conjunctival pallor. PERRL. EOMI.  No nystagmus. Globes nontender. No injection or chemosis. ENT   Head:   Normocephalic and atraumatic.   Nose:   No congestion/rhinnorhea. No septal hematoma   Mouth/Throat:   MMM, no pharyngeal erythema. No peritonsillar mass. Poor dentition  Neck:   No stridor. No SubQ emphysema. No meningismus. Hematological/Lymphatic/Immunilogical:   No cervical lymphadenopathy. Cardiovascular:   RRR. Symmetric bilateral radial and DP pulses.  No murmurs.  Respiratory:   Normal respiratory effort without tachypnea nor retractions. Breath sounds are clear and equal bilaterally. No wheezes/rales/rhonchi. Gastrointestinal:   Soft and nontender. Non distended. There is no CVA tenderness.  No rebound, rigidity, or guarding. Genitourinary:   deferred Musculoskeletal:   Normal range of motion in all extremities. No joint effusions.  No lower extremity tenderness.  No edema. Neurologic:   Normal speech and language.  CN 2-10 normal. Motor grossly  intact. No gross focal neurologic deficits are appreciated.  Skin:    Skin is warm, dry and intact. No rash noted.  No petechiae, purpura, or bullae.  ____________________________________________    LABS (pertinent positives/negatives) (all labs ordered are listed, but only abnormal results are displayed) Labs Reviewed  URINALYSIS, COMPLETE (UACMP) WITH MICROSCOPIC - Abnormal; Notable for the following:       Result Value   Color, Urine YELLOW (*)    APPearance CLEAR (*)    Ketones, ur 5 (*)    All other components within normal limits  BASIC METABOLIC PANEL   ____________________________________________   EKG    ____________________________________________    RADIOLOGY  No results found.  ____________________________________________   PROCEDURES Procedures  ____________________________________________   INITIAL IMPRESSION / ASSESSMENT AND PLAN / ED COURSE  Pertinent labs & imaging results that were available during my care of the patient were reviewed by me and considered in my medical decision making (see chart for details).  Patient well appearing no acute distress. Complains of a very limited exposure of dilute antifreeze yesterday. No evidence of burn injury or any secondary injury. No evidence of airway compromise. Vital signs are unremarkable, patient is calm and comfortable, very low suspicion for a significant toxic ingestion. I'll perform a limited workup with BMP and urinalysis to ensure the patient is not acidotic. 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         ____________________________________________   FINAL CLINICAL IMPRESSION(S) / ED DIAGNOSES  Final diagnoses:  Ingestion of nontoxic substance, accidental or unintentional, initial encounter  Fatigue, unspecified type  Mild dehydration      New  Prescriptions   No medications on file     Portions of this note were generated with dragon dictation software. Dictation errors may occur despite best attempts at proofreading.    Sharman Cheek, MD 04/26/16 [phone]

## 2016-04-26 NOTE — ED Notes (Signed)
Pt states since his exposure yesterday his vision seems to be worse and he feels fatigued.  He is not reporting any pain, and is AOx4.

## 2016-04-26 NOTE — ED Triage Notes (Signed)
Patient ambulatory to triage with steady gait, without difficulty or distress noted; pt reports working on car yesterday, radiator cap flew off and antifreeze went into his mouth hitting back of throat; st rinsed his mouth

## 2017-01-09 ENCOUNTER — Encounter: Payer: Self-pay | Admitting: Emergency Medicine

## 2017-01-09 ENCOUNTER — Emergency Department
Admission: EM | Admit: 2017-01-09 | Discharge: 2017-01-09 | Disposition: A | Discharge status: Home / Self Care | Payer: No Typology Code available for payment source | Attending: Emergency Medicine | Admitting: Emergency Medicine

## 2017-01-09 ENCOUNTER — Emergency Department: Payer: No Typology Code available for payment source

## 2017-01-09 DIAGNOSIS — Y92009 Unspecified place in unspecified non-institutional (private) residence as the place of occurrence of the external cause: Secondary | ICD-10-CM

## 2017-01-09 DIAGNOSIS — S299XXA Unspecified injury of thorax, initial encounter: Secondary | ICD-10-CM | POA: Diagnosis present

## 2017-01-09 DIAGNOSIS — Y999 Unspecified external cause status: Secondary | ICD-10-CM | POA: Insufficient documentation

## 2017-01-09 DIAGNOSIS — Y929 Unspecified place or not applicable: Secondary | ICD-10-CM | POA: Insufficient documentation

## 2017-01-09 DIAGNOSIS — S20212A Contusion of left front wall of thorax, initial encounter: Secondary | ICD-10-CM | POA: Diagnosis not present

## 2017-01-09 DIAGNOSIS — W19XXXA Unspecified fall, initial encounter: Secondary | ICD-10-CM

## 2017-01-09 DIAGNOSIS — F17210 Nicotine dependence, cigarettes, uncomplicated: Secondary | ICD-10-CM | POA: Diagnosis not present

## 2017-01-09 DIAGNOSIS — W11XXXA Fall on and from ladder, initial encounter: Secondary | ICD-10-CM | POA: Diagnosis not present

## 2017-01-09 DIAGNOSIS — Y939 Activity, unspecified: Secondary | ICD-10-CM | POA: Diagnosis not present

## 2017-01-09 DIAGNOSIS — M545 Low back pain: Secondary | ICD-10-CM | POA: Insufficient documentation

## 2017-01-09 MED ORDER — NAPROXEN 500 MG PO TABS: 500.0000 mg | ORAL_TABLET | Freq: Two times a day (BID) | ORAL | 0 refills | Status: AC

## 2017-01-09 MED ORDER — CYCLOBENZAPRINE HCL 5 MG PO TABS: 5.0000 mg | ORAL_TABLET | Freq: Three times a day (TID) | ORAL | 0 refills | Status: DC | PRN

## 2017-01-09 NOTE — Discharge Instructions (Signed)
Your exam and x-rays are negative. Take the prescription meds as directed. Apply ice or moist heat to reduce pain. Follow-up with [facility] for continued symptoms.

## 2017-01-09 NOTE — ED Triage Notes (Signed)
Pt in via POV, reports falling from ladder onto top of fence yesterday, complains of left rib and lower back pain.  Pt ambulatory to triage, vitals WDL, NAD noted at this time.

## 2017-01-09 NOTE — ED Provider Notes (Signed)
[facility] Emergency Department Provider Note ____________________________________________  Time seen: 1340  I have reviewed the triage vital signs and the nursing notes.  HISTORY  Chief Complaint  Fall and Back Pain  HPI Philip SharpsJames E Rice is a 56 y.o. male presents to the ED for evaluation of left rib and lower back pain. He describes pain related to a mechanical fall from a ladder he was using. He fell onto the top of a fence yesterday. He denies head injury, LOC, shortness of breath, or distal paresthesias.   Past Medical History:  Diagnosis Date  . Emphysema lung (HCC)    patient denies    There are no active problems to display for this patient.   Past Surgical History:  Procedure Laterality Date  . TONSILLECTOMY AND ADENOIDECTOMY      Prior to Admission medications   Medication Sig Start Date End Date Taking? Authorizing Provider  cyclobenzaprine (FLEXERIL) 5 MG tablet Take 1 tablet (5 mg total) 3 (three) times daily as needed by mouth for muscle spasms. 01/09/17   Kallista Pae, Charlesetta IvoryJenise V Bacon, PA-C  naproxen (NAPROSYN) 500 MG tablet Take 1 tablet (500 mg total) 2 (two) times daily with a meal by mouth. 01/09/17 02/08/17  Lalania Haseman, Charlesetta IvoryJenise V Bacon, PA-C    Allergies Patient has no known allergies.  No family history on file.  Social History Social History   Tobacco Use  . Smoking status: Current Every Day Smoker    Packs/day: 0.50    Types: Cigarettes  . Smokeless tobacco: Former Engineer, waterUser  Substance Use Topics  . Alcohol use: No  . Drug use: Yes    Types: Marijuana    Review of Systems  Constitutional: Negative for fever. Cardiovascular: Negative for chest pain. Respiratory: Negative for shortness of breath. Gastrointestinal: Negative for abdominal pain, vomiting and diarrhea. Genitourinary: Negative for dysuria. Musculoskeletal: Positive for left rib and lower back pain. Skin: Negative for rash. Neurological: Negative for headaches, focal  weakness or numbness. ____________________________________________  PHYSICAL EXAM:  VITAL SIGNS: ED Triage Vitals  Enc Vitals Group     BP 01/09/17 1213 126/72     Pulse Rate 01/09/17 1213 71     Resp 01/09/17 1213 16     Temp 01/09/17 1213 98.9 F (37.2 C)     Temp Source 01/09/17 1213 Oral     SpO2 01/09/17 1213 99 %     Weight 01/09/17 1214 150 lb (68 kg)     Height 01/09/17 1214 5\' 10"  (1.778 m)     Head Circumference --      Peak Flow --      Pain Score 01/09/17 1213 5     Pain Loc --      Pain Edu? --      Excl. in GC? --     Constitutional: Alert and oriented. Well appearing and in no distress. Head: Normocephalic and atraumatic. Neck: Supple. No thyromegaly. Cardiovascular: Normal rate, regular rhythm. Normal distal pulses. Respiratory: Normal respiratory effort. No wheezes/rales/rhonchi. No chest wall deformity, ecchymosis, or flail chest.  Gastrointestinal: Soft and nontender. No distention. Musculoskeletal: Normal spinal alignment without midline, spasm, deformity, or step-off. Nontender with normal range of motion in all extremities.  Neurologic:  Normal gait without ataxia. Normal speech and language. No gross focal neurologic deficits are appreciated. Skin:  Skin is warm, dry and intact. No rash noted. ____________________________________________   RADIOLOGY  Lumbar Spine   IMPRESSION: No evident fracture or spondylolisthesis. No appreciable arthropathy. There is aortic atherosclerosis.  Aortic Atherosclerosis (ICD10-I70.0).  Left Rib Detail/CXR  IMPRESSION: No evident rib fracture.  No edema or consolidation. ____________________________________________  INITIAL IMPRESSION / ASSESSMENT AND PLAN / ED COURSE  Patient presents to the ED for evaluation of chest wall pain after a mechanical fall from ladder. His exam is overall benign. No radiology evidence of fracture or dislocation. He is discharged with Felxeril and Naproxen.   ____________________________________________  FINAL CLINICAL IMPRESSION(S) / ED DIAGNOSES  Final diagnoses:  Fall in home, initial encounter  Chest wall contusion, left, initial encounter      Lissa HoardMenshew, Lonell Stamos V Bacon, PA-C 01/09/17 1625    Sharman CheekStafford, Phillip, MD 01/10/17 2107

## 2017-05-03 ENCOUNTER — Emergency Department: Payer: Self-pay

## 2017-05-03 ENCOUNTER — Emergency Department
Admission: EM | Admit: 2017-05-03 | Discharge: 2017-05-03 | Disposition: A | Discharge status: Home / Self Care | Payer: Self-pay | Attending: Emergency Medicine | Admitting: Emergency Medicine

## 2017-05-03 ENCOUNTER — Encounter: Payer: Self-pay | Admitting: Emergency Medicine

## 2017-05-03 ENCOUNTER — Other Ambulatory Visit: Payer: Self-pay

## 2017-05-03 DIAGNOSIS — Z79899 Other long term (current) drug therapy: Secondary | ICD-10-CM | POA: Insufficient documentation

## 2017-05-03 DIAGNOSIS — J101 Influenza due to other identified influenza virus with other respiratory manifestations: Secondary | ICD-10-CM | POA: Insufficient documentation

## 2017-05-03 DIAGNOSIS — F17210 Nicotine dependence, cigarettes, uncomplicated: Secondary | ICD-10-CM | POA: Insufficient documentation

## 2017-05-03 LAB — INFLUENZA PANEL BY PCR (TYPE A & B)
Influenza A By PCR: POSITIVE — AB
Influenza B By PCR: NEGATIVE

## 2017-05-03 MED ORDER — ONDANSETRON HCL 4 MG/2ML IJ SOLN
4.0000 mg | Freq: Once | INTRAMUSCULAR | Status: AC
  Administered 2017-05-03: 4 mg via INTRAVENOUS
  Filled 2017-05-03: qty 2

## 2017-05-03 MED ORDER — SODIUM CHLORIDE 0.9 % IV BOLUS (SEPSIS)
1000.0000 mL | Freq: Once | INTRAVENOUS | Status: AC
  Administered 2017-05-03: 1000 mL via INTRAVENOUS

## 2017-05-03 MED ORDER — PSEUDOEPH-BROMPHEN-DM 30-2-10 MG/5ML PO SYRP: 5.0000 mL | ORAL_SOLUTION | Freq: Four times a day (QID) | ORAL | 0 refills | Status: DC | PRN

## 2017-05-03 MED ORDER — OSELTAMIVIR PHOSPHATE 75 MG PO CAPS: 75.0000 mg | ORAL_CAPSULE | Freq: Two times a day (BID) | ORAL | 0 refills | Status: AC

## 2017-05-03 MED ORDER — IBUPROFEN 600 MG PO TABS: 600.0000 mg | ORAL_TABLET | Freq: Four times a day (QID) | ORAL | 0 refills | Status: DC | PRN

## 2017-05-03 NOTE — ED Provider Notes (Incomplete)
[facility] Emergency Department Provider Note   ____________________________________________   First MD Initiated Contact with Patient 05/03/17 1342     (approximate)  I have reviewed the triage vital signs and the nursing notes.   HISTORY  Chief Complaint Chills; Generalized Body Aches; and Cough    HPI Philip Rice is a 57 y.o. male ***  {**SYMPTOM/COMPLAINT LOCATION(describe anatomically) DURATION(when did it start) TIMING(onset and pattern) SEVERITY(0-10, mild/moderate/severe) QUALITY(description of symptoms) CONTEXT(recent surgery, new meds, activity, etc.) MODIFYINGFACTORS(what makes it better/worse) ASSOCIATEDSYMPTOMS(pertinent positives and negatives)**} Past Medical History:  Diagnosis Date  . Emphysema lung (HCC)    patient denies    There are no active problems to display for this patient.   Past Surgical History:  Procedure Laterality Date  . TONSILLECTOMY AND ADENOIDECTOMY      Prior to Admission medications   Medication Sig Start Date End Date Taking? Authorizing Provider  cyclobenzaprine (FLEXERIL) 5 MG tablet Take 1 tablet (5 mg total) 3 (three) times daily as needed by mouth for muscle spasms. 01/09/17   Menshew, Charlesetta IvoryJenise V Bacon, PA-C    Allergies Patient has no known allergies.  History reviewed. No pertinent family history.  Social History Social History   Tobacco Use  . Smoking status: Current Every Day Smoker    Packs/day: 0.50    Types: Cigarettes  . Smokeless tobacco: Former Engineer, waterUser  Substance Use Topics  . Alcohol use: No  . Drug use: Yes    Types: Marijuana    Review of Systems {** Revise as appropriate then delete this line - Documentation of 10 systems is required  **} Constitutional: No fever/chills Eyes: No visual changes. ENT: No sore throat. Cardiovascular: Denies chest pain. Respiratory: Denies shortness of breath. Gastrointestinal: No abdominal pain.  No nausea, no vomiting.   No diarrhea.  No constipation. Genitourinary: Negative for dysuria. Musculoskeletal: Negative for back pain. Skin: Negative for rash. Neurological: Negative for headaches, focal weakness or numbness. {**Psychiatric: Endocrine: Hematological/Lymphatic: Allergic/Immunilogical: **}  ____________________________________________   PHYSICAL EXAM:  VITAL SIGNS: ED Triage Vitals  Enc Vitals Group     BP 05/03/17 1313 (!) 143/96     Pulse Rate 05/03/17 1313 100     Resp 05/03/17 1313 20     Temp 05/03/17 1313 99.2 F (37.3 C)     Temp Source 05/03/17 1313 Oral     SpO2 05/03/17 1313 99 %     Weight 05/03/17 1314 150 lb (68 kg)     Height 05/03/17 1314 5\' 10"  (1.778 m)     Head Circumference --      Peak Flow --      Pain Score 05/03/17 1314 9     Pain Loc --      Pain Edu? --      Excl. in GC? --    {** Revise as appropriate then delete this line - 8 systems required **} Constitutional: Alert and oriented. Well appearing and in no acute distress. Eyes: Conjunctivae are normal. PERRL. EOMI. Head: Atraumatic. Nose: No congestion/rhinnorhea. Mouth/Throat: Mucous membranes are moist.  Oropharynx non-erythematous. Neck: No stridor.  {**No cervical spine tenderness to palpation.**} {**Hematological/Lymphatic/Immunilogical: No cervical lymphadenopathy. **}Cardiovascular: Normal rate, regular rhythm. Grossly normal heart sounds.  Good peripheral circulation. Respiratory: Normal respiratory effort.  No retractions. Lungs CTAB. Gastrointestinal: Soft and nontender. No distention. No abdominal bruits. No CVA tenderness. {**Genitourinary:  **}Musculoskeletal: No lower extremity tenderness nor edema.  No joint effusions. Neurologic:  Normal speech and language. No gross focal neurologic  deficits are appreciated. No gait instability. Skin:  Skin is warm, dry and intact. No rash noted. Psychiatric: Mood and affect are normal. Speech and behavior are  normal.  ____________________________________________   LABS (all labs ordered are listed, but only abnormal results are displayed)  Labs Reviewed  INFLUENZA PANEL BY PCR (TYPE A & B)   ____________________________________________  EKG  *** ____________________________________________  RADIOLOGY  ED MD interpretation:  ***  Official radiology report(s): No results found.  ____________________________________________   PROCEDURES  Procedure(s) performed: {Name/None:19197::"***, see procedure note(s).","None"}  Procedures  Critical Care performed: {CriticalCareYesNo:19197::"Yes, see critical care note(s)","No"}  ____________________________________________   INITIAL IMPRESSION / ASSESSMENT AND PLAN / ED COURSE  As part of my medical decision making, I reviewed the following data within the electronic MEDICAL RECORD NUMBER {Mdm:60447::"Notes from prior ED visits","[place] Controlled Substance Database"}   ***      ____________________________________________   FINAL CLINICAL IMPRESSION(S) / ED DIAGNOSES  Final diagnoses:  None     ED Discharge Orders    None       Note:  This document was prepared using Dragon voice recognition software and may include unintentional dictation errors.

## 2017-05-03 NOTE — ED Notes (Signed)
NAD noted at time of D/C. Pt denies questions or concerns. Pt ambulatory to the lobby at this time.  

## 2017-05-03 NOTE — ED Triage Notes (Signed)
Pt to ED c/o productive green cough, chills, generalized body aches x3 days.  Denies flu shot this year.

## 2017-05-03 NOTE — ED Provider Notes (Signed)
[facility] Emergency Department Provider Note   ____________________________________________   First MD Initiated Contact with Patient 05/03/17 1342     (approximate)  I have reviewed the triage vital signs and the nursing notes.   HISTORY  Chief Complaint Chills; Generalized Body Aches; and Cough    HPI Philip Rice is a 57 y.o. male patient complain 2-3 days of generalized body aches, green productive cough, fever/chills, patient stated nausea without vomiting.  Patient stated no flu shot for this season.  Patient rates his pain discomfort as a 9/10.  Patient describes his pain as "generalized aching".  No palliative measure for complaint.  Patient has a history of emphysema.  Positive tobacco use.   Past Medical History:  Diagnosis Date  . Emphysema lung (HCC)    patient denies    There are no active problems to display for this patient.   Past Surgical History:  Procedure Laterality Date  . TONSILLECTOMY AND ADENOIDECTOMY      Prior to Admission medications   Medication Sig Start Date End Date Taking? Authorizing Provider  brompheniramine-pseudoephedrine-DM 30-2-10 MG/5ML syrup Take 5 mLs by mouth 4 (four) times daily as needed. 05/03/17   Joni ReiningSmith, Abishai Viegas K, PA-C  cyclobenzaprine (FLEXERIL) 5 MG tablet Take 1 tablet (5 mg total) 3 (three) times daily as needed by mouth for muscle spasms. 01/09/17   Menshew, Charlesetta IvoryJenise V Bacon, PA-C  ibuprofen (ADVIL,MOTRIN) 600 MG tablet Take 1 tablet (600 mg total) by mouth every 6 (six) hours as needed. 05/03/17   Joni ReiningSmith, Damaria Stofko K, PA-C  oseltamivir (TAMIFLU) 75 MG capsule Take 1 capsule (75 mg total) by mouth 2 (two) times daily for 5 days. 05/03/17 05/08/17  Joni ReiningSmith, Luverna Degenhart K, PA-C    Allergies Patient has no known allergies.  History reviewed. No pertinent family history.  Social History Social History   Tobacco Use  . Smoking status: Current Every Day Smoker    Packs/day: 0.50    Types: Cigarettes  .  Smokeless tobacco: Former Engineer, waterUser  Substance Use Topics  . Alcohol use: No  . Drug use: Yes    Types: Marijuana    Review of Systems Constitutional fever/chills and body aches eyes: No visual changes. ENT: Nasal congestion runny nose Cardiovascular: Denies chest pain. Respiratory: Denies shortness of breath. Gastrointestinal: No abdominal pain.  Nausea vomiting.  No diarrhea.  No constipation. Genitourinary: Negative for dysuria. Musculoskeletal: Negative for back pain. Skin: Negative for rash. Neurological: Positive for headaches, but denies focal weakness or numbness.   ____________________________________________   PHYSICAL EXAM:  VITAL SIGNS: ED Triage Vitals  Enc Vitals Group     BP 05/03/17 1313 (!) 143/96     Pulse Rate 05/03/17 1313 100     Resp 05/03/17 1313 20     Temp 05/03/17 1313 99.2 F (37.3 C)     Temp Source 05/03/17 1313 Oral     SpO2 05/03/17 1313 99 %     Weight 05/03/17 1314 150 lb (68 kg)     Height 05/03/17 1314 5\' 10"  (1.778 m)     Head Circumference --      Peak Flow --      Pain Score 05/03/17 1314 9     Pain Loc --      Pain Edu? --      Excl. in GC? --     Constitutional: Alert and oriented. Appears malaise Nose: Edematous nasal turbinate clear rhinorrhea. Mouth/Throat: Mucous membranes are moist.  Oropharynx non-erythematous.  Postnasal  drainage. Neck: No stridor. Hematological/Lymphatic/Immunilogical: No cervical lymphadenopathy. Cardiovascular: Normal rate, regular rhythm. Grossly normal heart sounds.  Good peripheral circulation. Respiratory: Normal respiratory effort.  No retractions. Lungs CTAB. Gastrointestinal: Soft and nontender. No distention. No abdominal bruits. No CVA tenderness. Skin:  Skin is warm, dry and intact. No rash noted. Psychiatric: Mood and affect are normal. Speech and behavior are normal.  ____________________________________________   LABS (all labs ordered are listed, but only abnormal results are  displayed)  Labs Reviewed  INFLUENZA PANEL BY PCR (TYPE A & B) - Abnormal; Notable for the following components:      Result Value   Influenza A By PCR POSITIVE (*)    All other components within normal limits   ____________________________________________  EKG   ____________________________________________  RADIOLOGY  ED MD interpretation: No acute findings on chest x-ray. Official radiology report(s): Dg Chest 2 View  Result Date: 05/03/2017 CLINICAL DATA:  Smoker with cough, chills, fever, body aches for 2 days EXAM: CHEST  2 VIEW COMPARISON:  01/09/2017 FINDINGS: The heart size and mediastinal contours are within normal limits. Both lungs are clear. The visualized skeletal structures are unremarkable. IMPRESSION: No active cardiopulmonary disease. Electronically Signed   By: Elige Ko   On: 05/03/2017 14:37    ____________________________________________   PROCEDURES  Procedure(s) performed: None  Procedures  Critical Care performed: No  ____________________________________________   INITIAL IMPRESSION / ASSESSMENT AND PLAN / ED COURSE  As part of my medical decision making, I reviewed the following data within the electronic MEDICAL RECORD NUMBER    Viral illness secondary to influenza A.  Discussed last x-ray findings with patient.  Patient given discharge care instruction advised take medication as directed.  Patient advised to follow with [facility] if condition persists.      ____________________________________________   FINAL CLINICAL IMPRESSION(S) / ED DIAGNOSES  Final diagnoses:  Influenza A     ED Discharge Orders        Ordered    oseltamivir (TAMIFLU) 75 MG capsule  2 times daily     05/03/17 1514    brompheniramine-pseudoephedrine-DM 30-2-10 MG/5ML syrup  4 times daily PRN     05/03/17 1514    ibuprofen (ADVIL,MOTRIN) 600 MG tablet  Every 6 hours PRN     05/03/17 1514       Note:  This document was prepared using Dragon  voice recognition software and may include unintentional dictation errors.    Joni Reining, PA-C 05/03/17 1522    Governor Rooks, MD 05/03/17 [phone]

## 2017-05-03 NOTE — ED Notes (Signed)
Pt presents to ED with c/o cough, generalized body aches and chills x several days. Pt states has been "jumping in and out of hot showers to warm up". Pt with noted dry cough at this time. Pt is alert and oriented. Pt states he feels like his whole body aches, also c/o nausea x 2 days and states has not been able to keep anything down.

## 2017-05-10 ENCOUNTER — Observation Stay
Admission: EM | Admit: 2017-05-10 | Discharge: 2017-05-12 | Disposition: A | Discharge status: Home / Self Care | Payer: No Typology Code available for payment source | Attending: Internal Medicine | Admitting: Internal Medicine

## 2017-05-10 ENCOUNTER — Emergency Department: Payer: No Typology Code available for payment source

## 2017-05-10 ENCOUNTER — Encounter: Payer: Self-pay | Admitting: Internal Medicine

## 2017-05-10 ENCOUNTER — Other Ambulatory Visit: Payer: Self-pay

## 2017-05-10 DIAGNOSIS — J1100 Influenza due to unidentified influenza virus with unspecified type of pneumonia: Secondary | ICD-10-CM

## 2017-05-10 DIAGNOSIS — J189 Pneumonia, unspecified organism: Principal | ICD-10-CM | POA: Insufficient documentation

## 2017-05-10 DIAGNOSIS — R531 Weakness: Secondary | ICD-10-CM | POA: Insufficient documentation

## 2017-05-10 DIAGNOSIS — Z79899 Other long term (current) drug therapy: Secondary | ICD-10-CM | POA: Insufficient documentation

## 2017-05-10 DIAGNOSIS — F17210 Nicotine dependence, cigarettes, uncomplicated: Secondary | ICD-10-CM | POA: Diagnosis present

## 2017-05-10 DIAGNOSIS — Z23 Encounter for immunization: Secondary | ICD-10-CM | POA: Insufficient documentation

## 2017-05-10 DIAGNOSIS — E440 Moderate protein-calorie malnutrition: Secondary | ICD-10-CM

## 2017-05-10 DIAGNOSIS — Z806 Family history of leukemia: Secondary | ICD-10-CM | POA: Insufficient documentation

## 2017-05-10 DIAGNOSIS — J441 Chronic obstructive pulmonary disease with (acute) exacerbation: Secondary | ICD-10-CM | POA: Insufficient documentation

## 2017-05-10 LAB — BASIC METABOLIC PANEL
Anion gap: 9 (ref 5–15)
BUN: 17 mg/dL (ref 6–20)
CALCIUM: 8.9 mg/dL (ref 8.9–10.3)
CO2: 24 mmol/L (ref 22–32)
CREATININE: 0.81 mg/dL (ref 0.61–1.24)
Chloride: 106 mmol/L (ref 101–111)
GFR calc Af Amer: >60 mL/min (ref >60)
GFR calc non Af Amer: >60 mL/min (ref >60)
GLUCOSE: 135 mg/dL — AB (ref 65–99)
Potassium: 3.6 mmol/L (ref 3.5–5.1)
Sodium: 139 mmol/L (ref 135–145)

## 2017-05-10 LAB — TROPONIN I

## 2017-05-10 LAB — CBC
HCT: 42.2 % (ref 40.0–52.0)
HEMOGLOBIN: 14.4 g/dL (ref 13.0–18.0)
MCH: 29.8 pg (ref 26.0–34.0)
MCHC: 34.1 g/dL (ref 32.0–36.0)
MCV: 87.5 fL (ref 80.0–100.0)
PLATELETS: 400 10*3/uL (ref 150–440)
RBC: 4.83 MIL/uL (ref 4.40–5.90)
RDW: 12.8 % (ref 11.5–14.5)
WBC: 17 10*3/uL — ABNORMAL HIGH (ref 3.8–10.6)

## 2017-05-10 LAB — INFLUENZA PANEL BY PCR (TYPE A & B)
INFLBPCR: NEGATIVE
Influenza A By PCR: NEGATIVE

## 2017-05-10 LAB — LACTIC ACID, PLASMA: Lactic Acid, Venous: 1 mmol/L (ref 0.5–1.9)

## 2017-05-10 MED ORDER — METHYLPREDNISOLONE SODIUM SUCC 125 MG IJ SOLR
125.0000 mg | Freq: Once | INTRAMUSCULAR | Status: AC
Start: 2017-05-10 — End: 2017-05-10
  Administered 2017-05-10: 125 mg via INTRAVENOUS
  Filled 2017-05-10: qty 2

## 2017-05-10 MED ORDER — PNEUMOCOCCAL VAC POLYVALENT 25 MCG/0.5ML IJ INJ
0.5000 mL | INJECTION | INTRAMUSCULAR | Status: AC
  Administered 2017-05-12: 0.5 mL via INTRAMUSCULAR
  Filled 2017-05-10 (×2): qty 0.5

## 2017-05-10 MED ORDER — BUDESONIDE 0.25 MG/2ML IN SUSP
0.2500 mg | Freq: Two times a day (BID) | RESPIRATORY_TRACT | Status: DC
  Administered 2017-05-11 – 2017-05-12 (×3): 0.25 mg via RESPIRATORY_TRACT
  Filled 2017-05-10 (×3): qty 2

## 2017-05-10 MED ORDER — METHYLPREDNISOLONE SODIUM SUCC 125 MG IJ SOLR
60.0000 mg | Freq: Four times a day (QID) | INTRAMUSCULAR | Status: DC
  Administered 2017-05-10 – 2017-05-11 (×3): 60 mg via INTRAVENOUS
  Filled 2017-05-10 (×3): qty 2

## 2017-05-10 MED ORDER — SODIUM CHLORIDE 0.9 % IV SOLN
1.0000 g | INTRAVENOUS | Status: DC
  Administered 2017-05-11: 1 g via INTRAVENOUS
  Filled 2017-05-10 (×2): qty 10

## 2017-05-10 MED ORDER — ENOXAPARIN SODIUM 40 MG/0.4ML ~~LOC~~ SOLN
40.0000 mg | SUBCUTANEOUS | Status: DC
  Administered 2017-05-10: 21:00:00 40 mg via SUBCUTANEOUS
  Filled 2017-05-10 (×2): qty 0.4

## 2017-05-10 MED ORDER — SODIUM CHLORIDE 0.9 % IV SOLN
1.0000 g | Freq: Once | INTRAVENOUS | Status: AC
  Administered 2017-05-10: 1 g via INTRAVENOUS
  Filled 2017-05-10: qty 10

## 2017-05-10 MED ORDER — IPRATROPIUM-ALBUTEROL 0.5-2.5 (3) MG/3ML IN SOLN
3.0000 mL | RESPIRATORY_TRACT | Status: DC
  Administered 2017-05-10: 3 mL via RESPIRATORY_TRACT
  Filled 2017-05-10: qty 3

## 2017-05-10 MED ORDER — VANCOMYCIN HCL IN DEXTROSE 1-5 GM/200ML-% IV SOLN
1000.0000 mg | Freq: Once | INTRAVENOUS | Status: AC
  Administered 2017-05-10: 1000 mg via INTRAVENOUS
  Filled 2017-05-10: qty 200

## 2017-05-10 MED ORDER — DOXYCYCLINE HYCLATE 100 MG PO TABS
100.0000 mg | ORAL_TABLET | Freq: Once | ORAL | Status: AC
  Administered 2017-05-10: 100 mg via ORAL
  Filled 2017-05-10: qty 1

## 2017-05-10 MED ORDER — SODIUM CHLORIDE 0.9 % IV SOLN
INTRAVENOUS | Status: DC
  Administered 2017-05-10 – 2017-05-11 (×2): via INTRAVENOUS

## 2017-05-10 MED ORDER — ACETAMINOPHEN 325 MG PO TABS: 650.0000 mg | ORAL_TABLET | Freq: Four times a day (QID) | ORAL | Status: DC | PRN

## 2017-05-10 MED ORDER — IPRATROPIUM-ALBUTEROL 0.5-2.5 (3) MG/3ML IN SOLN
3.0000 mL | Freq: Once | RESPIRATORY_TRACT | Status: AC
  Administered 2017-05-10: 3 mL via RESPIRATORY_TRACT
  Filled 2017-05-10: qty 3

## 2017-05-10 MED ORDER — ONDANSETRON HCL 4 MG PO TABS: 4.0000 mg | ORAL_TABLET | Freq: Four times a day (QID) | ORAL | Status: DC | PRN

## 2017-05-10 MED ORDER — ACETAMINOPHEN 650 MG RE SUPP: 650.0000 mg | Freq: Four times a day (QID) | RECTAL | Status: DC | PRN

## 2017-05-10 MED ORDER — GUAIFENESIN ER 600 MG PO TB12
600.0000 mg | ORAL_TABLET | Freq: Two times a day (BID) | ORAL | Status: DC
  Administered 2017-05-10 – 2017-05-12 (×4): 600 mg via ORAL
  Filled 2017-05-10 (×4): qty 1

## 2017-05-10 MED ORDER — CYCLOBENZAPRINE HCL 10 MG PO TABS: 5.0000 mg | ORAL_TABLET | Freq: Three times a day (TID) | ORAL | Status: DC | PRN

## 2017-05-10 MED ORDER — IBUPROFEN 400 MG PO TABS: 600.0000 mg | ORAL_TABLET | Freq: Four times a day (QID) | ORAL | Status: DC | PRN

## 2017-05-10 MED ORDER — AZITHROMYCIN 500 MG IV SOLR
500.0000 mg | Freq: Every day | INTRAVENOUS | Status: DC
  Administered 2017-05-11: 500 mg via INTRAVENOUS
  Filled 2017-05-10 (×2): qty 500

## 2017-05-10 MED ORDER — SODIUM CHLORIDE 0.9 % IV BOLUS (SEPSIS)
1000.0000 mL | Freq: Once | INTRAVENOUS | Status: AC
  Administered 2017-05-10: 1000 mL via INTRAVENOUS

## 2017-05-10 MED ORDER — NICOTINE 21 MG/24HR TD PT24
21.0000 mg | MEDICATED_PATCH | Freq: Every day | TRANSDERMAL | Status: DC
  Filled 2017-05-10 (×3): qty 1

## 2017-05-10 MED ORDER — GUAIFENESIN-CODEINE 100-10 MG/5ML PO SOLN: 10.0000 mL | ORAL | Status: DC | PRN

## 2017-05-10 MED ORDER — ONDANSETRON HCL 4 MG/2ML IJ SOLN: 4.0000 mg | Freq: Four times a day (QID) | INTRAMUSCULAR | Status: DC | PRN

## 2017-05-10 MED ORDER — KETOROLAC TROMETHAMINE 30 MG/ML IJ SOLN
15.0000 mg | Freq: Once | INTRAMUSCULAR | Status: AC
  Administered 2017-05-10: 15 mg via INTRAVENOUS
  Filled 2017-05-10: qty 1

## 2017-05-10 NOTE — ED Provider Notes (Addendum)
[facility] Emergency Department Provider Note  ____________________________________________  Time seen: Approximately 5:57 PM  I have reviewed the triage vital signs and the nursing notes.   HISTORY  Chief Complaint Weakness   HPI Philip Rice is a 57 y.o. male with a history of COPD who presents for evaluation of flulike symptoms. Patient was seen here a week ago and diagnosed with influenza A. He was discharged home with Tamiflu however was unable to afford it. He reports that for the course of the last week he has been feeling worse. Complaining of constant moderate to severe myalgias, arthralgias, nausea, one daily episode of diarrhea, productive cough. He denies fever or chills, chest pain. He does endorse mild shortness of breath which becomes moderate with minimal exertion. He does not use any inhalers at home. He is not on any steroids. He continues to smoke.  Past Medical History:  Diagnosis Date  . Emphysema lung (HCC)    patient denies    There are no active problems to display for this patient.   Past Surgical History:  Procedure Laterality Date  . TONSILLECTOMY AND ADENOIDECTOMY      Prior to Admission medications   Medication Sig Start Date End Date Taking? Authorizing Provider  brompheniramine-pseudoephedrine-DM 30-2-10 MG/5ML syrup Take 5 mLs by mouth 4 (four) times daily as needed. 05/03/17  Yes Joni Reining, PA-C  ibuprofen (ADVIL,MOTRIN) 600 MG tablet Take 1 tablet (600 mg total) by mouth every 6 (six) hours as needed. 05/03/17  Yes Joni Reining, PA-C  cyclobenzaprine (FLEXERIL) 5 MG tablet Take 1 tablet (5 mg total) 3 (three) times daily as needed by mouth for muscle spasms. Patient not taking: Reported on 05/10/2017 01/09/17   Menshew, Charlesetta Ivory, PA-C    Allergies Patient has no known allergies.  No family history on file.  Social History Social History   Tobacco Use  . Smoking status: Current Every Day Smoker      Packs/day: 0.50    Types: Cigarettes  . Smokeless tobacco: Former Engineer, water Use Topics  . Alcohol use: No  . Drug use: Yes    Types: Marijuana    Review of Systems  Constitutional: Negative for fever. + body aches Eyes: Negative for visual changes. ENT: Negative for sore throat. Neck: No neck pain  Cardiovascular: Negative for chest pain. Respiratory: + shortness of breath, cough Gastrointestinal: Negative for abdominal pain, vomiting. + nausea, and diarrhea. Genitourinary: Negative for dysuria. Musculoskeletal: Negative for back pain. Skin: Negative for rash. Neurological: Negative for headaches, weakness or numbness. Psych: No SI or HI  ____________________________________________   PHYSICAL EXAM:  VITAL SIGNS: ED Triage Vitals  Enc Vitals Group     BP 05/10/17 1507 110/70     Pulse Rate 05/10/17 1507 89     Resp 05/10/17 1507 20     Temp 05/10/17 1507 98.5 F (36.9 C)     Temp Source 05/10/17 1507 Oral     SpO2 --      Weight --      Height --      Head Circumference --      Peak Flow --      Pain Score 05/10/17 1508 8     Pain Loc --      Pain Edu? --      Excl. in GC? --     Constitutional: Alert and oriented. Well appearing and in no apparent distress. HEENT:      Head:  Normocephalic and atraumatic.         Eyes: Conjunctivae are normal. Sclera is non-icteric.       Mouth/Throat: Mucous membranes are moist.       Neck: Supple with no signs of meningismus. Cardiovascular: Regular rate and rhythm. No murmurs, gallops, or rubs. 2+ symmetrical distal pulses are present in all extremities. No JVD. Respiratory: Normal respiratory effort, normal sats, decreased air movement bilaterally, no crackles or wheezes.  Gastrointestinal: Soft, non tender, and non distended with positive bowel sounds. No rebound or guarding. Musculoskeletal: Nontender with normal range of motion in all extremities. No edema, cyanosis, or erythema of extremities. Neurologic:  Normal speech and language. Face is symmetric. Moving all extremities. No gross focal neurologic deficits are appreciated. Skin: Skin is warm, dry and intact. No rash noted. Psychiatric: Mood and affect are normal. Speech and behavior are normal.  ____________________________________________   LABS (all labs ordered are listed, but only abnormal results are displayed)  Labs Reviewed  BASIC METABOLIC PANEL - Abnormal; Notable for the following components:      Result Value   Glucose, Bld 135 (*)    All other components within normal limits  CBC - Abnormal; Notable for the following components:   WBC 17.0 (*)    All other components within normal limits  CULTURE, BLOOD (ROUTINE X 2)  CULTURE, BLOOD (ROUTINE X 2)  TROPONIN I  URINALYSIS, COMPLETE (UACMP) WITH MICROSCOPIC  LACTIC ACID, PLASMA   ____________________________________________  EKG  ED ECG REPORT I, Nita Sicklearolina Jon Lall, the attending physician, personally viewed and interpreted this ECG.  Normal sinus rhythm, rate of 88, normal intervals, left axis deviation, LAFB, no ST elevations or depressions. 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   Interpretation by Radiologist:  Dg Chest 2 View  Result Date: 05/10/2017 CLINICAL DATA:  Short of breath EXAM: CHEST - 2 VIEW COMPARISON:  05/03/2017 FINDINGS: Hyperinflation with bronchitic changes. No pleural effusion. Mild infiltrate in the lingula. Normal heart size. Aortic atherosclerosis. No pneumothorax. IMPRESSION: Mild lingular infiltrate. Radiographic follow-up to resolution is recommended. Electronically Signed   By: Jasmine PangKim  Fujinaga M.D.   On: 05/10/2017 18:15      ____________________________________________   PROCEDURES  Procedure(s) performed: None Procedures Critical Care  performed:  None ____________________________________________   INITIAL IMPRESSION / ASSESSMENT AND PLAN / ED COURSE   57 y.o. male with a history of COPD who presents for evaluation of flulike symptoms x 1 week. Diagnosed with Influenza A 7 days ago, unable to afford Tamiflu. Patient is in no respiratory distress, does have diminished air breath sounds bilaterally with no crackles or wheezing, normal work of breathing, normal sats, afebrile with normal vital signs. Labs show leukocytosis with white count of 17. Chest x-ray is pending. Differential diagnoses includes pneumonia versus bronchitis versus influenza versus COPD exacerbation. We'll give duoneb and Solu-Medrol. Will give IVF and toradol for body aches    _________________________ 6:45 PM on 05/10/2017 -----------------------------------------  Chest x-ray concerning for pneumonia. We'll start patient on Rocephin, vancomycin, and doxycycline. We'll admit to the hospitalist service.   As part of my medical decision making, I reviewed the following data within the electronic MEDICAL RECORD NUMBER Nursing notes reviewed and incorporated, Labs reviewed , EKG interpreted , Old chart reviewed, Radiograph reviewed , Discussed with admitting physician , Notes from prior ED visits  and [place] Controlled Substance Database    Pertinent labs & imaging results that were available during my care of the patient were reviewed by me and considered in my medical decision making (see chart for details).    ____________________________________________   FINAL CLINICAL IMPRESSION(S) / ED DIAGNOSES  Final diagnoses:  Pneumonia and influenza  COPD exacerbation (HCC)      NEW MEDICATIONS STARTED DURING THIS VISIT:  ED Discharge Orders    None       Note:  This document was prepared using Dragon voice recognition software and may include unintentional dictation errors.    Don Perking, [place], MD 05/10/17 1845    Nita Sickle,  MD 05/10/17 [phone]

## 2017-05-10 NOTE — H&P (Signed)
[facility] - [place] at [facility]   PATIENT NAME: Philip Rice    MR#:  [id number]  DATE OF BIRTH:  07/27/1960  DATE OF ADMISSION:  05/10/2017  PRIMARY CARE PHYSICIAN: Patient, No Pcp Per   REQUESTING/REFERRING PHYSICIAN: Cecil Cobbsaroline Veronese MD  CHIEF COMPLAINT:   Chief Complaint  Patient presents with  . Weakness    HISTORY OF PRESENT ILLNESS: Philip Rice  is a 57 y.o. male with a known history of COPD however patient denies continues to smoke who was seen in the ER recently with diagnosis of flu who returns back with complaint of shortness of breath cough and congestion.  Patient states that he is also been wheezing.  He is not sure if he had any fevers.  Denies any chest pain or palpitations in the ER he was noted to have pneumonia.  And white blood cell count is elevated.  PAST MEDICAL HISTORY:   Past Medical History:  Diagnosis Date  . Emphysema lung (HCC)    patient denies    PAST SURGICAL HISTORY:  Past Surgical History:  Procedure Laterality Date  . TONSILLECTOMY AND ADENOIDECTOMY      SOCIAL HISTORY:  Social History   Tobacco Use  . Smoking status: Current Every Day Smoker    Packs/day: 0.50    Types: Cigarettes  . Smokeless tobacco: Former Engineer, waterUser  Substance Use Topics  . Alcohol use: No    FAMILY HISTORY:  Family History  Problem Relation Age of Onset  . Leukemia Mother     DRUG ALLERGIES: No Known Allergies  REVIEW OF SYSTEMS:   CONSTITUTIONAL: No fever, fatigue or weakness.  EYES: No blurred or double vision.  EARS, NOSE, AND THROAT: No tinnitus or ear pain.  RESPIRATORY: Positive cough, positive shortness of breath, positive wheezing or hemoptysis.  CARDIOVASCULAR: No chest pain, orthopnea, edema.  GASTROINTESTINAL: No nausea, vomiting, diarrhea or abdominal pain.  GENITOURINARY: No dysuria, hematuria.  ENDOCRINE: No polyuria, nocturia,  HEMATOLOGY: No anemia, easy bruising or bleeding SKIN: No rash or  lesion. MUSCULOSKELETAL: No joint pain or arthritis.   NEUROLOGIC: No tingling, numbness, weakness.  PSYCHIATRY: No anxiety or depression.   MEDICATIONS AT HOME:  Prior to Admission medications   Medication Sig Start Date End Date Taking? Authorizing Provider  brompheniramine-pseudoephedrine-DM 30-2-10 MG/5ML syrup Take 5 mLs by mouth 4 (four) times daily as needed. 05/03/17  Yes Joni ReiningSmith, Ronald K, PA-C  ibuprofen (ADVIL,MOTRIN) 600 MG tablet Take 1 tablet (600 mg total) by mouth every 6 (six) hours as needed. 05/03/17  Yes Joni ReiningSmith, Ronald K, PA-C  cyclobenzaprine (FLEXERIL) 5 MG tablet Take 1 tablet (5 mg total) 3 (three) times daily as needed by mouth for muscle spasms. Patient not taking: Reported on 05/10/2017 01/09/17   Menshew, Charlesetta IvoryJenise V Bacon, PA-C      PHYSICAL EXAMINATION:   VITAL SIGNS: Blood pressure 110/70, pulse 89, temperature 98.5 F (36.9 C), temperature source Oral, resp. rate 20.  GENERAL:  57 y.o.-year-old patient lying in the bed with no acute distress.  EYES: Pupils equal, round, reactive to light and accommodation. No scleral icterus. Extraocular muscles intact.  HEENT: Head atraumatic, normocephalic. Oropharynx and nasopharynx clear.  NECK:  Supple, no jugular venous distention. No thyroid enlargement, no tenderness.  LUNGS: Diminished breath sounds bilaterally without any accessory muscle usage.  CARDIOVASCULAR: S1, S2 normal. No murmurs, rubs, or gallops.  ABDOMEN: Soft, nontender, nondistended. Bowel sounds present. No organomegaly or mass.  EXTREMITIES: No pedal edema, cyanosis, or clubbing.  NEUROLOGIC: Cranial  nerves II through XII are intact. Muscle strength 5/5 in all extremities. Sensation intact. Gait not checked.  PSYCHIATRIC: The patient is alert and oriented x 3.  SKIN: No obvious rash, lesion, or ulcer.   LABORATORY PANEL:   CBC Recent Labs  Lab 05/10/17 1518  WBC 17.0*  HGB 14.4  HCT 42.2  PLT 400  MCV 87.5  MCH 29.8  MCHC 34.1  RDW 12.8    ------------------------------------------------------------------------------------------------------------------  Chemistries  Recent Labs  Lab 05/10/17 1518  NA 139  K 3.6  CL 106  CO2 24  GLUCOSE 135*  BUN 17  CREATININE 0.81  CALCIUM 8.9   ------------------------------------------------------------------------------------------------------------------ estimated creatinine clearance is 96.8 mL/min (by C-G formula based on SCr of 0.81 mg/dL). ------------------------------------------------------------------------------------------------------------------ No results for input(s): TSH, T4TOTAL, T3FREE, THYROIDAB in the last 72 hours.  Invalid input(s): FREET3   Coagulation profile No results for input(s): INR, PROTIME in the last 168 hours. ------------------------------------------------------------------------------------------------------------------- No results for input(s): DDIMER in the last 72 hours. -------------------------------------------------------------------------------------------------------------------  Cardiac Enzymes Recent Labs  Lab 05/10/17 1518  TROPONINI <0.03   ------------------------------------------------------------------------------------------------------------------ Invalid input(s): POCBNP  ---------------------------------------------------------------------------------------------------------------  Urinalysis    Component Value Date/Time   COLORURINE YELLOW (A) 04/26/2016 0729   APPEARANCEUR CLEAR (A) 04/26/2016 0729   LABSPEC 1.021 04/26/2016 0729   PHURINE 5.0 04/26/2016 0729   GLUCOSEU NEGATIVE 04/26/2016 0729   HGBUR NEGATIVE 04/26/2016 0729   BILIRUBINUR NEGATIVE 04/26/2016 0729   KETONESUR 5 (A) 04/26/2016 0729   PROTEINUR NEGATIVE 04/26/2016 0729   NITRITE NEGATIVE 04/26/2016 0729   LEUKOCYTESUR NEGATIVE 04/26/2016 0729     RADIOLOGY: Dg Chest 2 View  Result Date: 05/10/2017 CLINICAL DATA:  Short of breath  EXAM: CHEST - 2 VIEW COMPARISON:  05/03/2017 FINDINGS: Hyperinflation with bronchitic changes. No pleural effusion. Mild infiltrate in the lingula. Normal heart size. Aortic atherosclerosis. No pneumothorax. IMPRESSION: Mild lingular infiltrate. Radiographic follow-up to resolution is recommended. Electronically Signed   By: Jasmine Pang M.D.   On: 05/10/2017 18:15    EKG: Orders placed or performed during the hospital encounter of 05/10/17  . ED EKG  . ED EKG    IMPRESSION AND PLAN: Patient is 57 year old with history of nicotine abuse presenting with shortness of breath  1.  Community-acquired pneumonia we will treat with IV ceftriaxone azithromycin  2.  Acute on chronic COPD exasperation we will treat with nebulizer therapy, Mucinex Solu-Medrol  3.  Nicotine abuse smoking cessation provided 4 min spent strongly recommend he stop smoking nicotine patch will be started    All the records are reviewed and case discussed with ED provider. Management plans discussed with the patient, family and they are in agreement.  CODE STATUS: Code Status History    This patient does not have a recorded code status. Please follow your organizational policy for patients in this situation.       TOTAL TIME TAKING CARE OF THIS PATIENT: 55 minutes.    Auburn Bilberry M.D on 05/10/2017 at 7:10 PM  Between 7am to 6pm - Pager - [phone]  After 6pm go to [email or web address] - password [email or web address] [facility]  [facility] Office  [phone]  CC: Primary care physician; Patient, No Pcp Per

## 2017-05-10 NOTE — ED Triage Notes (Signed)
Patient reports seen in ED approximately 1 week ago for similar symptoms.  Patient reports weakness and joint aches.

## 2017-05-11 LAB — CBC
HCT: 41.8 % (ref 40.0–52.0)
HEMOGLOBIN: 13.9 g/dL (ref 13.0–18.0)
MCH: 29.5 pg (ref 26.0–34.0)
MCHC: 33.3 g/dL (ref 32.0–36.0)
MCV: 88.6 fL (ref 80.0–100.0)
Platelets: 396 10*3/uL (ref 150–440)
RBC: 4.71 MIL/uL (ref 4.40–5.90)
RDW: 12.6 % (ref 11.5–14.5)
WBC: 11.5 10*3/uL — AB (ref 3.8–10.6)

## 2017-05-11 LAB — URINALYSIS, COMPLETE (UACMP) WITH MICROSCOPIC
Bacteria, UA: NONE SEEN
Glucose, UA: 50 mg/dL — AB
Hgb urine dipstick: NEGATIVE
KETONES UR: 5 mg/dL — AB
LEUKOCYTES UA: NEGATIVE
Nitrite: NEGATIVE
PROTEIN: 100 mg/dL — AB
Specific Gravity, Urine: 1.035 — ABNORMAL HIGH (ref 1.005–1.030)
pH: 6 (ref 5.0–8.0)

## 2017-05-11 LAB — BASIC METABOLIC PANEL
ANION GAP: 10 (ref 5–15)
BUN: 19 mg/dL (ref 6–20)
CHLORIDE: 107 mmol/L (ref 101–111)
CO2: 23 mmol/L (ref 22–32)
CREATININE: 0.76 mg/dL (ref 0.61–1.24)
Calcium: 8.7 mg/dL — ABNORMAL LOW (ref 8.9–10.3)
GFR calc non Af Amer: >60 mL/min (ref >60)
Glucose, Bld: 146 mg/dL — ABNORMAL HIGH (ref 65–99)
POTASSIUM: 3.6 mmol/L (ref 3.5–5.1)
SODIUM: 140 mmol/L (ref 135–145)

## 2017-05-11 LAB — EXPECTORATED SPUTUM ASSESSMENT W GRAM STAIN, RFLX TO RESP C

## 2017-05-11 LAB — EXPECTORATED SPUTUM ASSESSMENT W REFEX TO RESP CULTURE

## 2017-05-11 MED ORDER — IPRATROPIUM-ALBUTEROL 0.5-2.5 (3) MG/3ML IN SOLN
3.0000 mL | Freq: Four times a day (QID) | RESPIRATORY_TRACT | Status: DC
  Administered 2017-05-11 – 2017-05-12 (×6): 3 mL via RESPIRATORY_TRACT
  Filled 2017-05-11 (×6): qty 3

## 2017-05-11 NOTE — Progress Notes (Signed)
[facility] - [place] at [facility]   PATIENT NAME: Philip Rice    MR#:  [id number]  DATE OF BIRTH:  [birth year]  SUBJECTIVE:  CHIEF COMPLAINT:   Chief Complaint  Patient presents with  . Weakness   Recently found to have flu, treated as an outpatient last week. Still felt continued shortness of breath and cough so came back to emergency room. Also have generalized fatigue complaint. Noted to have pneumonia. Feels slightly better today but have excessive shaking / jittery after receiving IV steroid.  REVIEW OF SYSTEMS:  CONSTITUTIONAL: No fever, have fatigue or weakness.  EYES: No blurred or double vision.  EARS, NOSE, AND THROAT: No tinnitus or ear pain.  RESPIRATORY: Have cough, shortness of breath, no wheezing or hemoptysis.  CARDIOVASCULAR: No chest pain, orthopnea, edema.  GASTROINTESTINAL: No nausea, vomiting, diarrhea or abdominal pain.  GENITOURINARY: No dysuria, hematuria.  ENDOCRINE: No polyuria, nocturia,  HEMATOLOGY: No anemia, easy bruising or bleeding SKIN: No rash or lesion. MUSCULOSKELETAL: No joint pain or arthritis.   NEUROLOGIC: No tingling, numbness, weakness.  PSYCHIATRY: No anxiety or depression.   ROS  DRUG ALLERGIES:  No Known Allergies  VITALS:  Blood pressure 98/75, pulse 77, temperature 97.6 F (36.4 C), temperature source Oral, resp. rate 16, height 5\' 10"  (1.778 m), weight 58.2 kg (128 lb 3.2 oz), SpO2 98 %.  PHYSICAL EXAMINATION:  GENERAL:  57 y.o.-year-old patient lying in the bed with no acute distress.  EYES: Pupils equal, round, reactive to light and accommodation. No scleral icterus. Extraocular muscles intact.  HEENT: Head atraumatic, normocephalic. Oropharynx and nasopharynx clear.  NECK:  Supple, no jugular venous distention. No thyroid enlargement, no tenderness.  LUNGS: Normal breath sounds bilaterally, no wheezing, bilateral crepitation. No use of accessory muscles of respiration.  CARDIOVASCULAR: S1, S2 normal. No  murmurs, rubs, or gallops.  ABDOMEN: Soft, nontender, nondistended. Bowel sounds present. No organomegaly or mass.  EXTREMITIES: No pedal edema, cyanosis, or clubbing.  NEUROLOGIC: Cranial nerves II through XII are intact. Muscle strength 4-5/5 in all extremities. Sensation intact. Gait not checked.  PSYCHIATRIC: The patient is alert and oriented x 3.  SKIN: No obvious rash, lesion, or ulcer.   Physical Exam LABORATORY PANEL:   CBC Recent Labs  Lab 05/11/17 0416  WBC 11.5*  HGB 13.9  HCT 41.8  PLT 396   ------------------------------------------------------------------------------------------------------------------  Chemistries  Recent Labs  Lab 05/11/17 0416  NA 140  K 3.6  CL 107  CO2 23  GLUCOSE 146*  BUN 19  CREATININE 0.76  CALCIUM 8.7*   ------------------------------------------------------------------------------------------------------------------  Cardiac Enzymes Recent Labs  Lab 05/10/17 1518  TROPONINI <0.03   ------------------------------------------------------------------------------------------------------------------  RADIOLOGY:  Dg Chest 2 View  Result Date: 05/10/2017 CLINICAL DATA:  Short of breath EXAM: CHEST - 2 VIEW COMPARISON:  05/03/2017 FINDINGS: Hyperinflation with bronchitic changes. No pleural effusion. Mild infiltrate in the lingula. Normal heart size. Aortic atherosclerosis. No pneumothorax. IMPRESSION: Mild lingular infiltrate. Radiographic follow-up to resolution is recommended. Electronically Signed   By: Jasmine Pang M.D.   On: 05/10/2017 18:15    ASSESSMENT AND PLAN:   Active Problems:   PNA (pneumonia)  Patient is 57 year old with history of nicotine abuse presenting with shortness of breath  1.  Community-acquired pneumonia we will treat with IV ceftriaxone azithromycin   Recently treated for Flu, flu test is negative at this time. culture sent, follow.  2.  Acute on chronic COPD exasperation we will treat with  nebulizer therapy, Mucinex Solu-Medrol  3.  Nicotine abuse smoking cessation provided 4 min spent strongly recommend he stop smoking.   All the records are reviewed and case discussed with Care Management/Social Workerr. Management plans discussed with the patient, family and they are in agreement.  CODE STATUS: full.  TOTAL TIME TAKING CARE OF THIS PATIENT: 35 minutes.     POSSIBLE D/C IN 1-2 DAYS, DEPENDING ON CLINICAL CONDITION.   Altamese DillingVaibhavkumar Khaliq Turay M.D on 05/11/2017   Between 7am to 6pm - Pager - [phone]  After 6pm go to [email or web address] - password [email or web address]  [facility] [place] [facility]  Office  [phone]  CC: Primary care physician; Patient, No Pcp Per  Note: This dictation was prepared with Dragon dictation along with smaller phrase technology. Any transcriptional errors that result from this process are unintentional.

## 2017-05-11 NOTE — Plan of Care (Signed)
  Education: Knowledge of General Education information will improve 05/11/2017 1301 - Progressing by Donnel SaxonKennedy, Carlosdaniel Grob L, RN   Clinical Measurements: Will remain free from infection 05/11/2017 1301 - Progressing by Donnel SaxonKennedy, Khushi Zupko L, RN   Activity: Risk for activity intolerance will decrease 05/11/2017 1301 - Progressing by Donnel SaxonKennedy, Lilias Lorensen L, RN   Pain Managment: General experience of comfort will improve 05/11/2017 1301 - Progressing by Donnel SaxonKennedy, Philana Younis L, RN   Safety: Ability to remain free from injury will improve 05/11/2017 1301 - Progressing by Donnel SaxonKennedy, Trevor Duty L, RN   Skin Integrity: Risk for impaired skin integrity will decrease 05/11/2017 1301 - Progressing by Donnel SaxonKennedy, Mahina Salatino L, RN   Skin Integrity: Risk for impaired skin integrity will decrease 05/11/2017 1301 - Progressing by Donnel SaxonKennedy, Chrisanna Mishra L, RN

## 2017-05-12 DIAGNOSIS — E440 Moderate protein-calorie malnutrition: Secondary | ICD-10-CM

## 2017-05-12 MED ORDER — AZITHROMYCIN 250 MG PO TABS: ORAL_TABLET | ORAL | 0 refills | Status: AC

## 2017-05-12 MED ORDER — CEFUROXIME AXETIL 250 MG PO TABS: 250.0000 mg | ORAL_TABLET | Freq: Two times a day (BID) | ORAL | 0 refills | Status: AC

## 2017-05-12 MED ORDER — NICOTINE 21 MG/24HR TD PT24: 21.0000 mg | MEDICATED_PATCH | Freq: Every day | TRANSDERMAL | 0 refills | Status: DC

## 2017-05-12 MED ORDER — ADULT MULTIVITAMIN W/MINERALS CH
1.0000 | ORAL_TABLET | Freq: Every day | ORAL | Status: DC
  Administered 2017-05-12: 14:00:00 1 via ORAL
  Filled 2017-05-12: qty 1

## 2017-05-12 MED ORDER — ENSURE ENLIVE PO LIQD
237.0000 mL | Freq: Two times a day (BID) | ORAL | Status: DC
  Administered 2017-05-12 (×2): 237 mL via ORAL

## 2017-05-12 MED ORDER — ADULT MULTIVITAMIN W/MINERALS CH: 1.0000 | ORAL_TABLET | Freq: Every day | ORAL | 0 refills | Status: DC

## 2017-05-12 MED ORDER — ENSURE ENLIVE PO LIQD: 237.0000 mL | Freq: Two times a day (BID) | ORAL | 12 refills | Status: DC

## 2017-05-12 NOTE — Plan of Care (Signed)
  Progressing Education: Knowledge of General Education information will improve 05/12/2017 0940 - Progressing by Luretha MurphyMiles, Jayleigh Notarianni, RN Clinical Measurements: Will remain free from infection 05/12/2017 0940 - Progressing by Luretha MurphyMiles, Emaad Nanna, RN Activity: Risk for activity intolerance will decrease 05/12/2017 0940 - Progressing by Luretha MurphyMiles, Yariela Tison, RN Pain Managment: General experience of comfort will improve 05/12/2017 0940 - Progressing by Luretha MurphyMiles, Verdon Ferrante, RN Safety: Ability to remain free from injury will improve 05/12/2017 0940 - Progressing by Luretha MurphyMiles, Lanette Ell, RN Skin Integrity: Risk for impaired skin integrity will decrease 05/12/2017 0940 - Progressing by Luretha MurphyMiles, Ximenna Fonseca, RN

## 2017-05-12 NOTE — Progress Notes (Signed)
Discharged home at this time. Alert. No resp distress. Instructions discussed with pt . presc given and coupon per care management.for discounted meds.  Diet / activity and f/u discussed. Pt preferred to amb  Out. Car in parking lot. No distress. Sl d/cd.

## 2017-05-12 NOTE — Progress Notes (Signed)
Initial Nutrition Assessment  DOCUMENTATION CODES:   Underweight, Non-severe (moderate) malnutrition in context of chronic illness  INTERVENTION:   - Ensure Enlive po BID, each supplement provides 350 kcal and 20 grams of protein - MVI with minerals daily  NUTRITION DIAGNOSIS:   Moderate Malnutrition related to chronic illness(COPD) as evidenced by severe muscle depletion, moderate fat depletion.  GOAL:   Patient will meet greater than or equal to 90% of their needs  MONITOR:   PO intake, Supplement acceptance, Weight trends, Labs  REASON FOR ASSESSMENT:   Other (Comment)(Low BMI)   ASSESSMENT:   57 year old male who presented to ED with SOB, cough, and congestion. Pt seen a week ago and diagnosed with influenza A and treated outpatient. Pt with PMH significant for COPD. Pt admitted for IV antibiotic treatment of pneumonia and acute on chronic COPD exacerbation.  Spoke with pt at bedside who reports having a poor appetite for the past 7 years since work accident and eating "like a bird." Pt states that instead of having full meals, he "nibbles" throughout the day. Pt's usual intake consists of sweets, coffee, juice, Gatorade, and water. Pt states he has consumed oral nutrition supplements before but is unable to afford them. Pt agreeable to receiving Ensure Enlive during current hospital stay.  Pt reports appetite improving during hospital stay and that he ate grits, toast, and yogurt for breakfast.  Pt reports his UBW as 150 lbs and states he weighed this last week. RD unsure if this is accurate since it would indicate a 22 lb weight loss in 1 week. Pt endorses weight loss.  Pt denies difficulty chewing or swallowing. Pt endorses nausea when he "gets too hot" and denies vomiting.  Meal Completion: 100%  I/O's: +2.4 L since admission  Medications reviewed.  Labs reviewed: glucose 146 (H), glucosuria and ketonuria, WBC 11.5 (H)  NUTRITION - FOCUSED PHYSICAL EXAM:   Most Recent Value  Orbital Region  Mild depletion  Upper Arm Region  Moderate depletion  Thoracic and Lumbar Region  Moderate depletion  Buccal Region  Mild depletion  Temple Region  Moderate depletion  Clavicle Bone Region  Severe depletion  Clavicle and Acromion Bone Region  Severe depletion  Scapular Bone Region  Severe depletion  Dorsal Hand  Mild depletion  Patellar Region  Moderate depletion  Anterior Thigh Region  Moderate depletion  Posterior Calf Region  Mild depletion  Edema (RD Assessment)  None  Hair  Reviewed  Eyes  Reviewed  Mouth  Reviewed  Skin  Reviewed  Nails  Reviewed       Diet Order:  Diet regular Room service appropriate? Yes; Fluid consistency: Thin  EDUCATION NEEDS:   No education needs have been identified at this time  Skin:  Skin Assessment: Reviewed RN Assessment  Last BM:  05/12/17  Height:   Ht Readings from Last 1 Encounters:  05/10/17 5\' 10"  (1.778 m)    Weight:   Wt Readings from Last 1 Encounters:  05/10/17 128 lb 3.2 oz (58.2 kg)    Ideal Body Weight:  75.5 kg  BMI:  Body mass index is 18.39 kg/m.  Estimated Nutritional Needs:   Kcal:  1850-2050 kcal/day  Protein:  70-85 grams/day  Fluid:  >2.0 L/day    Earma ReadingKate Jablonski Aidah Forquer, MS, RD, LDN Pager: [phone] Weekend/After Hours: [phone]

## 2017-05-12 NOTE — Care Management Note (Signed)
Case Management Note  Patient Details  Name: Phoebe SharpsJames E Fjeld MRN: [id number] Date of Birth: 09/21/1960   Patient discharged today.  Self pay.  No PCP.  Patient provided with applications to Medication Management  And [facility].  Patient provided with coupons from [email or web address].  Ceftin $13.67 and Azithromycin $14.99.  Patient denies issues obtaining medication or transportation needs   Subjective/Objective:                    Action/Plan:   Expected Discharge Date:  05/12/17               Expected Discharge Plan:  Home/Self Care  In-House Referral:     Discharge planning Services  CM Consult, Indigent Health Clinic, Medication Assistance  Post Acute Care Choice:    Choice offered to:     DME Arranged:    DME Agency:     HH Arranged:    HH Agency:     Status of Service:  Completed, signed off  If discussed at [employer] Meetings, dates discussed:    Additional Comments:  Chapman FitchBOWEN, Lestine Rahe T, RN 05/12/2017, 3:31 PM

## 2017-05-12 NOTE — Plan of Care (Signed)
  Education: Knowledge of General Education information will improve 05/12/2017 0145 - Progressing by Geri SeminoleJackson, Namine Beahm A, RN   Clinical Measurements: Will remain free from infection 05/12/2017 0145 - Progressing by Geri SeminoleJackson, Nelton Amsden A, RN   Activity: Risk for activity intolerance will decrease 05/12/2017 0145 - Progressing by Geri SeminoleJackson, Maanav Kassabian A, RN   Pain Managment: General experience of comfort will improve 05/12/2017 0145 - Progressing by Geri SeminoleJackson, Taisha Pennebaker A, RN   Safety: Ability to remain free from injury will improve 05/12/2017 0145 - Progressing by Geri SeminoleJackson, Kayson Bullis A, RN   Skin Integrity: Risk for impaired skin integrity will decrease 05/12/2017 0145 - Progressing by Geri SeminoleJackson, Raigan Baria A, RN

## 2017-05-14 LAB — CULTURE, RESPIRATORY W GRAM STAIN: Culture: NORMAL

## 2017-05-14 LAB — CULTURE, RESPIRATORY

## 2017-05-14 NOTE — Discharge Summary (Signed)
[facility] [place] at [facility]   PATIENT NAME: Philip Rice    MR#:  [id number]  DATE OF BIRTH:  1960/06/09  DATE OF ADMISSION:  05/10/2017 ADMITTING PHYSICIAN: Auburn Bilberry, MD  DATE OF DISCHARGE: 05/12/2017  2:48 PM  PRIMARY CARE PHYSICIAN: Patient, No Pcp Per    ADMISSION DIAGNOSIS:  Pneumonia and influenza [J11.00] COPD exacerbation (HCC) [J44.1]  DISCHARGE DIAGNOSIS:  Active Problems:   PNA (pneumonia)   Malnutrition of moderate degree   SECONDARY DIAGNOSIS:   Past Medical History:  Diagnosis Date  . Emphysema lung (HCC)    patient denies    HOSPITAL COURSE:   Patient is 58 year old with history of nicotine abuse presenting with shortness of breath  1.Community-acquired pneumonia we will treat with IV ceftriaxone azithromycin   Recently treated for Flu, flu test is negative at this time. culture sent, follow.  2.Acute on chronic COPD exasperation we will treat with nebulizer therapy,Mucinex Solu-Medrol  3.Nicotine abuse smoking cessation provided 4 minspent strongly recommend he stop smoking.     DISCHARGE CONDITIONS:   Stable.  CONSULTS OBTAINED:    DRUG ALLERGIES:  No Known Allergies  DISCHARGE MEDICATIONS:   Allergies as of 05/12/2017   No Known Allergies     Medication List    TAKE these medications   azithromycin 250 MG tablet Commonly known as:  ZITHROMAX Z-PAK Take 2 tablets (500 mg) on  Day 1,  followed by 1 tablet (250 mg) once daily on Days 2 through 5.   brompheniramine-pseudoephedrine-DM 30-2-10 MG/5ML syrup Take 5 mLs by mouth 4 (four) times daily as needed.   cefUROXime 250 MG tablet Commonly known as:  CEFTIN Take 1 tablet (250 mg total) by mouth 2 (two) times daily for 3 days.   cyclobenzaprine 5 MG tablet Commonly known as:  FLEXERIL Take 1 tablet (5 mg total) 3 (three) times daily as needed by mouth for muscle spasms.   feeding supplement (ENSURE ENLIVE) Liqd Take 237 mLs  by mouth 2 (two) times daily between meals.   ibuprofen 600 MG tablet Commonly known as:  ADVIL,MOTRIN Take 1 tablet (600 mg total) by mouth every 6 (six) hours as needed.   multivitamin with minerals Tabs tablet Take 1 tablet by mouth daily.   nicotine 21 mg/24hr patch Commonly known as:  NICODERM CQ - dosed in mg/24 hours Place 1 patch (21 mg total) onto the skin daily.        DISCHARGE INSTRUCTIONS:    Follow with PMD in 1-2 weeks.  If you experience worsening of your admission symptoms, develop shortness of breath, life threatening emergency, suicidal or homicidal thoughts you must seek medical attention immediately by calling 911 or calling your MD immediately  if symptoms less severe.  You Must read complete instructions/literature along with all the possible adverse reactions/side effects for all the Medicines you take and that have been prescribed to you. Take any new Medicines after you have completely understood and accept all the possible adverse reactions/side effects.   Please note  You were cared for by a hospitalist during your hospital stay. If you have any questions about your discharge medications or the care you received while you were in the hospital after you are discharged, you can call the unit and asked to speak with the hospitalist on call if the hospitalist that took care of you is not available. Once you are discharged, your primary care physician will handle any further medical issues. Please note that NO REFILLS  for any discharge medications will be authorized once you are discharged, as it is imperative that you return to your primary care physician (or establish a relationship with a primary care physician if you do not have one) for your aftercare needs so that they can reassess your need for medications and monitor your lab values.    Today   CHIEF COMPLAINT:   Chief Complaint  Patient presents with  . Weakness    HISTORY OF PRESENT ILLNESS:   Philip Rice  is a 57 y.o. male with a known history of COPD however patient denies continues to smoke who was seen in the ER recently with diagnosis of flu who returns back with complaint of shortness of breath cough and congestion.  Patient states that he is also been wheezing.  He is not sure if he had any fevers.  Denies any chest pain or palpitations in the ER he was noted to have pneumonia.  And white blood cell count is elevated.    VITAL SIGNS:  Blood pressure 131/70, pulse (!) 54, temperature 98.1 F (36.7 C), temperature source Oral, resp. rate 20, height 5\' 10"  (1.778 m), weight 58.2 kg (128 lb 3.2 oz), SpO2 97 %.  I/O:  No intake or output data in the 24 hours ending 05/14/17 1028  PHYSICAL EXAMINATION:  GENERAL:  57 y.o.-year-old patient lying in the bed with no acute distress.  EYES: Pupils equal, round, reactive to light and accommodation. No scleral icterus. Extraocular muscles intact.  HEENT: Head atraumatic, normocephalic. Oropharynx and nasopharynx clear.  NECK:  Supple, no jugular venous distention. No thyroid enlargement, no tenderness.  LUNGS: Normal breath sounds bilaterally, no wheezing, rales,rhonchi or crepitation. No use of accessory muscles of respiration.  CARDIOVASCULAR: S1, S2 normal. No murmurs, rubs, or gallops.  ABDOMEN: Soft, non-tender, non-distended. Bowel sounds present. No organomegaly or mass.  EXTREMITIES: No pedal edema, cyanosis, or clubbing.  NEUROLOGIC: Cranial nerves II through XII are intact. Muscle strength 5/5 in all extremities. Sensation intact. Gait not checked.  PSYCHIATRIC: The patient is alert and oriented x 3.  SKIN: No obvious rash, lesion, or ulcer.   DATA REVIEW:   CBC Recent Labs  Lab 05/11/17 0416  WBC 11.5*  HGB 13.9  HCT 41.8  PLT 396    Chemistries  Recent Labs  Lab 05/11/17 0416  NA 140  K 3.6  CL 107  CO2 23  GLUCOSE 146*  BUN 19  CREATININE 0.76  CALCIUM 8.7*    Cardiac Enzymes Recent Labs  Lab  05/10/17 1518  TROPONINI <0.03    Microbiology Results  Results for orders placed or performed during the hospital encounter of 05/10/17  Blood culture (routine x 2)     Status: None (Preliminary result)   Collection Time: 05/10/17  6:55 PM  Result Value Ref Range Status   Specimen Description BLOOD LEFT FOREARM  Final   Special Requests   Final    BOTTLES DRAWN AEROBIC AND ANAEROBIC Blood Culture adequate volume   Culture   Final    NO GROWTH 4 DAYS Performed at [facility], [address], [place], [place] [address]    Report Status PENDING  Incomplete  Blood culture (routine x 2)     Status: None (Preliminary result)   Collection Time: 05/10/17  6:55 PM  Result Value Ref Range Status   Specimen Description BLOOD RIGHT ANTECUBITAL  Final   Special Requests   Final    BOTTLES DRAWN AEROBIC AND ANAEROBIC Blood Culture  results may not be optimal due to an excessive volume of blood received in culture bottles   Culture   Final    NO GROWTH 4 DAYS Performed at [facility], [address] [place], [place] [address]    Report Status PENDING  Incomplete  Culture, sputum-assessment     Status: None   Collection Time: 05/11/17 12:16 PM  Result Value Ref Range Status   Specimen Description SPUTUM  Final   Special Requests NONE  Final   Sputum evaluation   Final    THIS SPECIMEN IS ACCEPTABLE FOR SPUTUM CULTURE Performed at [facility], [address] [place], [place] [address]    Report Status 05/11/2017 FINAL  Final  Culture, respiratory (NON-Expectorated)     Status: None   Collection Time: 05/11/17 12:16 PM  Result Value Ref Range Status   Specimen Description   Final    SPUTUM Performed at [facility], [address] [place], [place] [address]    Special Requests   Final    NONE Reflexed from [id number] Performed at [facility], [address] [place], [place] [address]    Gram Stain   Final    MODERATE WBC PRESENT,  PREDOMINANTLY PMN RARE SQUAMOUS EPITHELIAL CELLS PRESENT RARE GRAM POSITIVE COCCI    Culture   Final    Consistent with normal respiratory flora. Performed at [facility], [address] [place], [place] [address]    Report Status 05/14/2017 FINAL  Final    RADIOLOGY:  No results found.  EKG:   Orders placed or performed during the hospital encounter of 05/10/17  . ED EKG  . ED EKG  . EKG      Management plans discussed with the patient, family and they are in agreement.  CODE STATUS:  Code Status History    Date Active Date Inactive Code Status Order ID Comments User Context   05/10/2017 20:28 05/12/2017 18:00 Full Code [id number]  Auburn BilberryPatel, Shreyang, MD Inpatient      TOTAL TIME TAKING CARE OF THIS PATIENT: 35 minutes.    Altamese DillingVaibhavkumar Adeja Sarratt M.D on 05/14/2017 at 10:28 AM  Between 7am to 6pm - Pager - [phone]  After 6pm go to [email or web address] - password [email or web address]  [facility]  Office  [phone]  CC: Primary care physician; Patient, No Pcp Per   Note: This dictation was prepared with Dragon dictation along with smaller phrase technology. Any transcriptional errors that result from this process are unintentional.

## 2017-05-15 LAB — CULTURE, BLOOD (ROUTINE X 2)
CULTURE: NO GROWTH
CULTURE: NO GROWTH
SPECIAL REQUESTS: ADEQUATE

## 2018-02-18 IMAGING — CR DG LUMBAR SPINE COMPLETE 4+V
1 series · 5 of 5 positions shown · non-contrast
Comparison: None.

CLINICAL DATA: Pain following fall

EXAM:
LUMBAR SPINE - COMPLETE 4+ VIEW

[Series 1: dg lumbar spine complete 4 +v · 0.14mm/px · 5 of 5 slices shown]
[im 1/5]
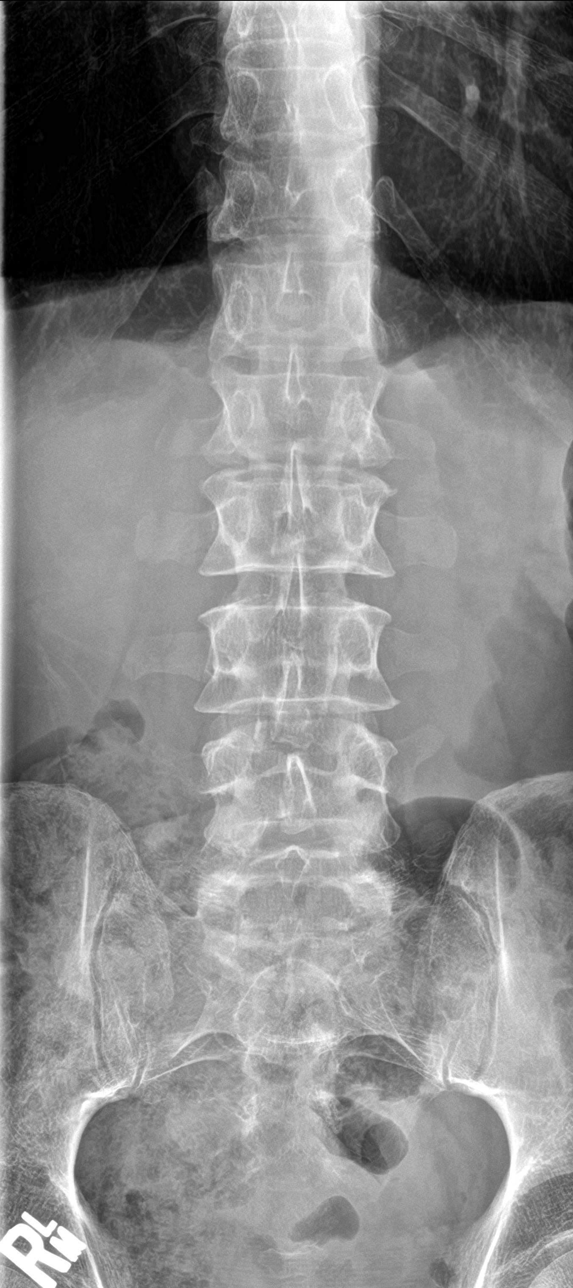
[im 2/5]
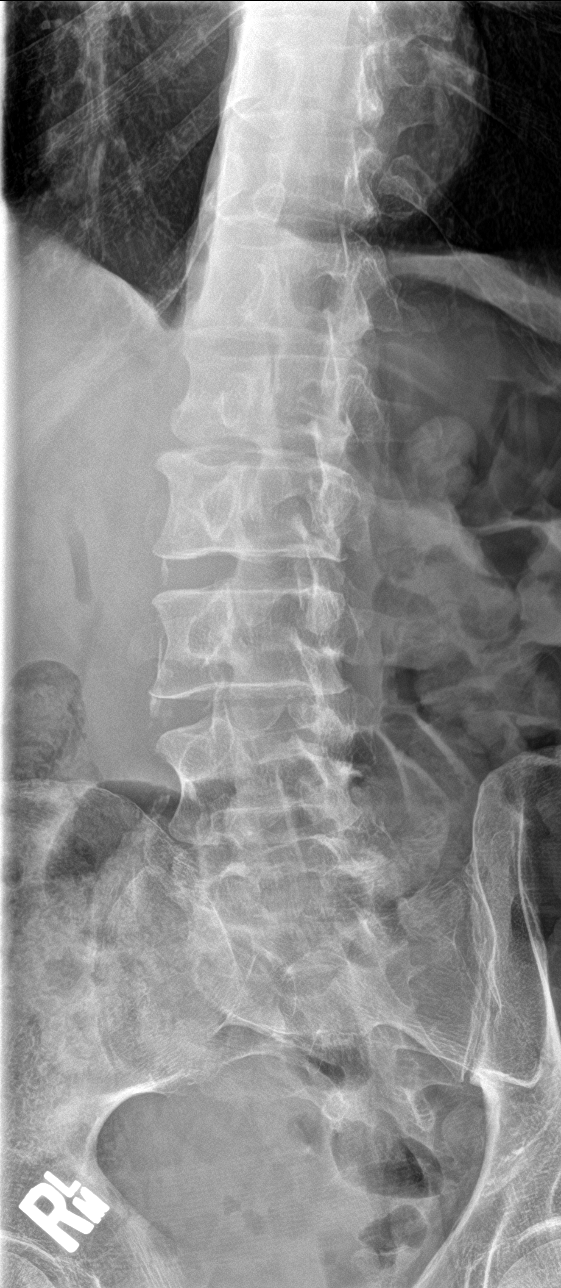
[im 3/5]
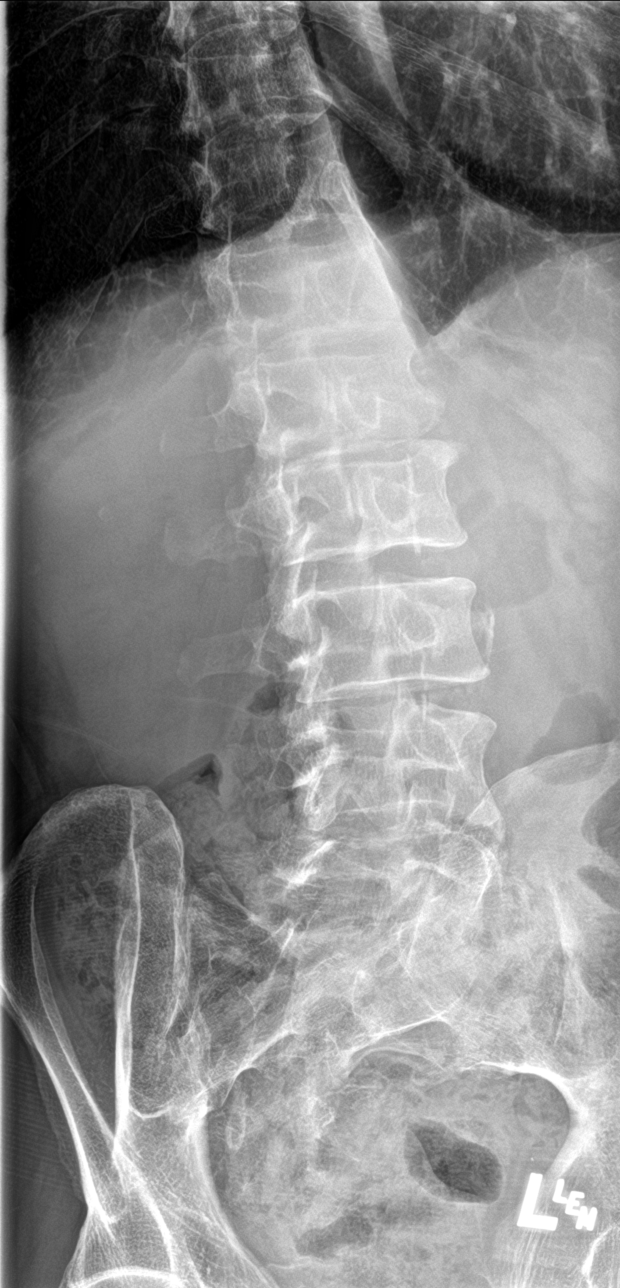
[im 4/5]
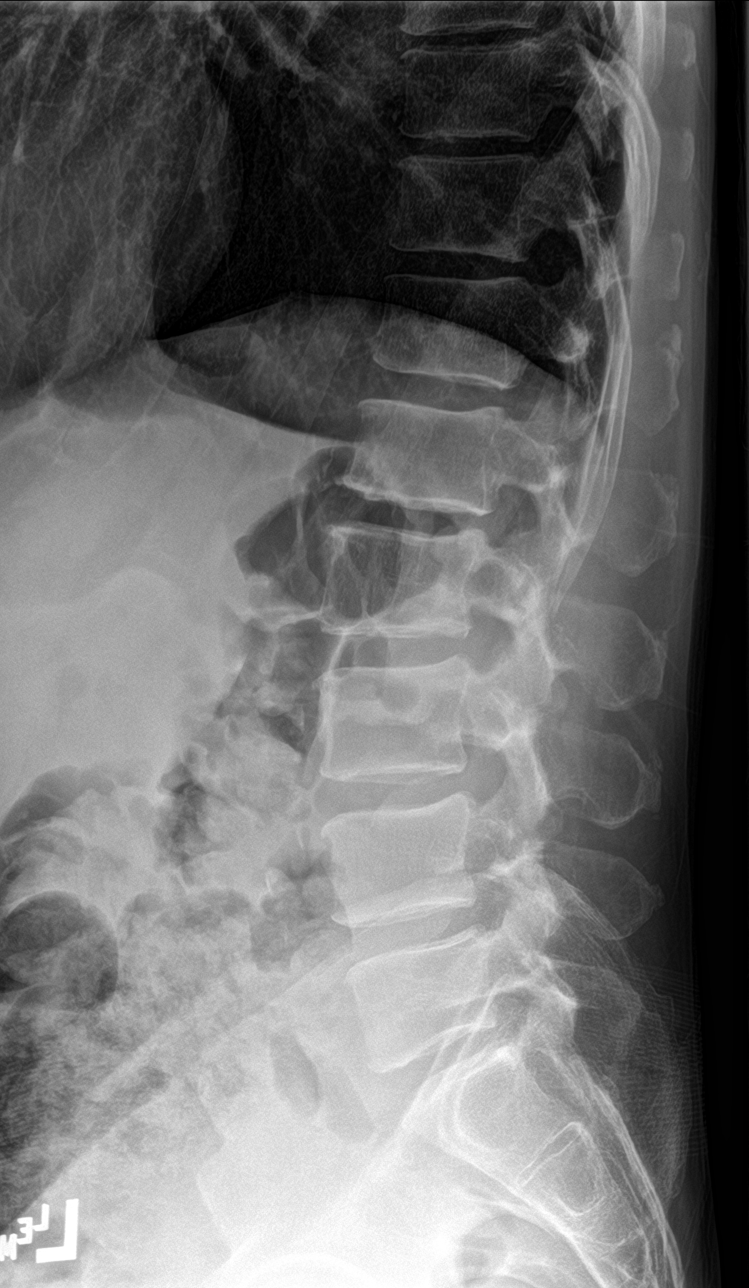
[im 5/5]
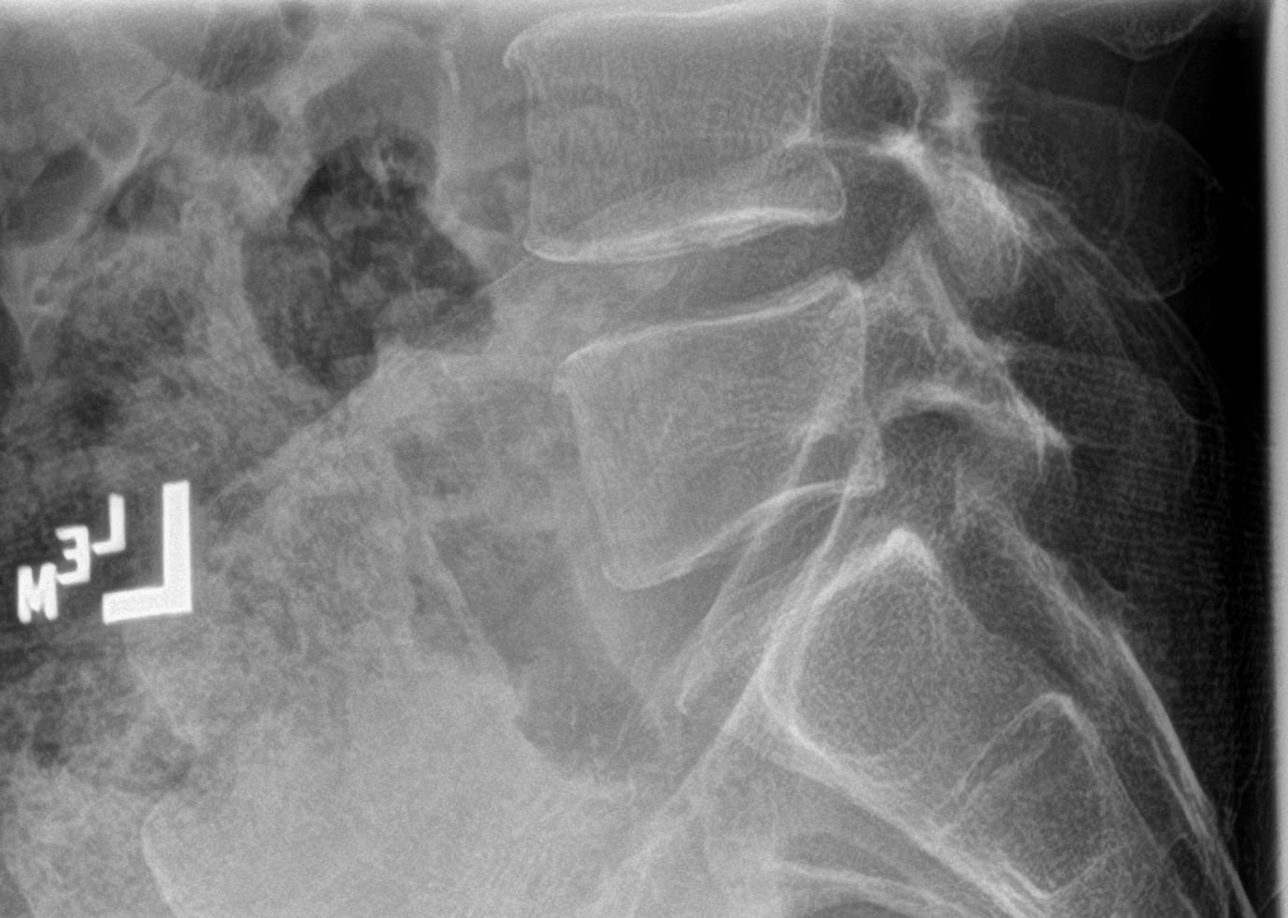

[5 of 5 positions shown; findings below may reference images not displayed]

FINDINGS: Frontal, lateral, spot lumbosacral lateral, and bilateral oblique
views were obtained. The there are 5 non rib-bearing lumbar type
vertebral bodies. There is no fracture or spondylolisthesis. The
disc spaces appear normal. There is no appreciable facet
osteoarthritic change. There is aortic atherosclerosis.
IMPRESSION: No evident fracture or spondylolisthesis. No appreciable
arthropathy. There is aortic atherosclerosis.

Aortic Atherosclerosis ([id number]-5BV.V).

## 2019-01-22 ENCOUNTER — Encounter: Payer: Self-pay | Admitting: Emergency Medicine

## 2019-01-22 ENCOUNTER — Emergency Department: Payer: Self-pay

## 2019-01-22 ENCOUNTER — Other Ambulatory Visit: Payer: Self-pay

## 2019-01-22 ENCOUNTER — Emergency Department
Admission: EM | Admit: 2019-01-22 | Discharge: 2019-01-22 | Disposition: A | Discharge status: Home / Self Care | Payer: Self-pay | Attending: Emergency Medicine | Admitting: Emergency Medicine

## 2019-01-22 DIAGNOSIS — F17210 Nicotine dependence, cigarettes, uncomplicated: Secondary | ICD-10-CM | POA: Insufficient documentation

## 2019-01-22 DIAGNOSIS — S82401A Unspecified fracture of shaft of right fibula, initial encounter for closed fracture: Secondary | ICD-10-CM | POA: Insufficient documentation

## 2019-01-22 DIAGNOSIS — W109XXA Fall (on) (from) unspecified stairs and steps, initial encounter: Secondary | ICD-10-CM | POA: Insufficient documentation

## 2019-01-22 DIAGNOSIS — Y999 Unspecified external cause status: Secondary | ICD-10-CM | POA: Insufficient documentation

## 2019-01-22 DIAGNOSIS — S93401A Sprain of unspecified ligament of right ankle, initial encounter: Secondary | ICD-10-CM | POA: Insufficient documentation

## 2019-01-22 DIAGNOSIS — Y939 Activity, unspecified: Secondary | ICD-10-CM | POA: Insufficient documentation

## 2019-01-22 DIAGNOSIS — Y929 Unspecified place or not applicable: Secondary | ICD-10-CM | POA: Insufficient documentation

## 2019-01-22 MED ORDER — TRAMADOL HCL 50 MG PO TABS
50.0000 mg | ORAL_TABLET | Freq: Once | ORAL | Status: AC
  Administered 2019-01-22: 50 mg via ORAL
  Filled 2019-01-22: qty 1

## 2019-01-22 MED ORDER — TRAMADOL HCL 50 MG PO TABS: 50.0000 mg | ORAL_TABLET | Freq: Four times a day (QID) | ORAL | 0 refills | Status: AC | PRN

## 2019-01-22 MED ORDER — IBUPROFEN 600 MG PO TABS
600.0000 mg | ORAL_TABLET | Freq: Once | ORAL | Status: AC
  Administered 2019-01-22: 11:00:00 600 mg via ORAL
  Filled 2019-01-22: qty 1

## 2019-01-22 MED ORDER — IBUPROFEN 600 MG PO TABS: 600.0000 mg | ORAL_TABLET | Freq: Three times a day (TID) | ORAL | 0 refills | Status: DC | PRN

## 2019-01-22 NOTE — ED Notes (Signed)
See triage note  Presents with pain to right ankle  States he fell from [place] the step broke  Swelling noted to anterior ankle  Good pulses he is able to stand w/o pain  States he felt a pop when this happened   States pain shoots to lateral aspect of lower leg

## 2019-01-22 NOTE — ED Triage Notes (Signed)
Pt reports ankle pain and swelling since last night after missing bottom step on porch. Pain worse worse with weight bearing. Visible swelling noted to right ankle.

## 2019-01-22 NOTE — ED Provider Notes (Signed)
[facility] Emergency Department Provider Note   ____________________________________________   First MD Initiated Contact with Patient 01/22/19 [phone]     (approximate)  I have reviewed the triage vital signs and the nursing notes.   HISTORY  Chief Complaint Ankle Pain    HPI Philip Rice is a 58 y.o. male patient presents with right lower leg and ankle pain after missing a step on the bottom portion of a porch.  Incident occurred last night.  Patient today weakness morning with increased pain and difficulty weightbearing.  Patient denies loss sensation or loss of function.  Patient rates pain as a 10/10.  Patient described pain is "achy".  No palliative measure for complaint.         Past Medical History:  Diagnosis Date  . Emphysema lung [facility])    patient denies    Patient Active Problem List   Diagnosis Date Noted  . Malnutrition of moderate degree 05/12/2017  . PNA (pneumonia) 05/10/2017    Past Surgical History:  Procedure Laterality Date  . TONSILLECTOMY AND ADENOIDECTOMY      Prior to Admission medications   Medication Sig Start Date End Date Taking? Authorizing Provider  feeding supplement, ENSURE ENLIVE, (ENSURE ENLIVE) LIQD Take 237 mLs by mouth 2 (two) times daily between meals. 05/12/17   Altamese Dilling, MD  ibuprofen (ADVIL) 600 MG tablet Take 1 tablet (600 mg total) by mouth every 8 (eight) hours as needed. 01/22/19   Joni Reining, PA-C  Multiple Vitamin (MULTIVITAMIN WITH MINERALS) TABS tablet Take 1 tablet by mouth daily. 05/12/17   Altamese Dilling, MD  traMADol (ULTRAM) 50 MG tablet Take 1 tablet (50 mg total) by mouth every 6 (six) hours as needed. 01/22/19 01/22/20  Joni Reining, PA-C    Allergies Patient has no known allergies.  Family History  Problem Relation Age of Onset  . Leukemia Mother     Social History Social History   Tobacco Use  . Smoking status: Current Every Day Smoker   Packs/day: 1.00    Types: Cigarettes  . Smokeless tobacco: Former Engineer, water Use Topics  . Alcohol use: No  . Drug use: Yes    Types: Marijuana    Review of Systems Constitutional: No fever/chills Eyes: No visual changes. ENT: No sore throat. Cardiovascular: Denies chest pain. Respiratory: Denies shortness of breath. Gastrointestinal: No abdominal pain.  No nausea, no vomiting.  No diarrhea.  No constipation. Genitourinary: Negative for dysuria. Musculoskeletal: Right lower leg pain. Skin: Negative for rash. Neurological: Negative for headaches, focal weakness or numbness.   ____________________________________________   PHYSICAL EXAM:  VITAL SIGNS: ED Triage Vitals  Enc Vitals Group     BP 01/22/19 0912 127/88     Pulse Rate 01/22/19 0912 93     Resp 01/22/19 0912 16     Temp 01/22/19 0912 97.9 F (36.6 C)     Temp Source 01/22/19 0912 Oral     SpO2 01/22/19 0912 99 %     Weight 01/22/19 0913 150 lb (68 kg)     Height 01/22/19 0913 5\' 10"  (1.778 m)     Head Circumference --      Peak Flow --      Pain Score 01/22/19 0913 10     Pain Loc --      Pain Edu? --      Excl. in GC? --    Constitutional: Alert and oriented. Well appearing and in no acute distress. Cardiovascular:  Normal rate, regular rhythm. Grossly normal heart sounds.  Good peripheral circulation. Respiratory: Normal respiratory effort.  No retractions. Lungs CTAB. Musculoskeletal: Obvious deformity to the right lower leg.  Patient has mild edema to the lateral right ankle.  Patient has moderate guarding palpation of the proximal fibular.   Neurologic:  Normal speech and language. No gross focal neurologic deficits are appreciated. No gait instability. Skin:  Skin is warm, dry and intact. No rash noted. Psychiatric: Mood and affect are normal. Speech and behavior are normal.  ____________________________________________   LABS (all labs ordered are listed, but only abnormal results are  displayed)  Labs Reviewed - No data to display ____________________________________________  EKG   ____________________________________________  RADIOLOGY  ED MD interpretation:    Official radiology report(s): Dg Tibia/fibula Right  Result Date: 01/22/2019 CLINICAL DATA:  Pain EXAM: RIGHT TIBIA AND FIBULA - 2 VIEW COMPARISON:  None. FINDINGS: There is a nondisplaced obliquely oriented fracture of the proximal fibula shaft. Tiny osseous flecks seen posterior to the proximal fibula. Overlying soft tissue swelling. Soft tissues are unremarkable. IMPRESSION: Nondisplaced proximal fibular shaft fracture. Electronically Signed   By: Jonna ClarkBindu  Avutu M.D.   On: 01/22/2019 10:07   Dg Ankle Complete Right  Result Date: 01/22/2019 CLINICAL DATA:  Pain secondary to fall EXAM: RIGHT ANKLE - COMPLETE 3+ VIEW COMPARISON:  None. FINDINGS: There is no evidence of fracture, dislocation, or joint effusion. There is no evidence of arthropathy or other focal bone abnormality. Mild soft tissue swelling seen over the lateral malleolus. Small ankle joint effusion is seen. IMPRESSION: No acute osseous abnormality. Electronically Signed   By: Jonna ClarkBindu  Avutu M.D.   On: 01/22/2019 10:06    ____________________________________________   PROCEDURES  Procedure(s) performed (including Critical Care):  Procedures   ____________________________________________   INITIAL IMPRESSION / ASSESSMENT AND PLAN / ED COURSE  As part of my medical decision making, I reviewed the following data within the electronic MEDICAL RECORD NUMBER    Patient presents with right lower leg and ankle pain secondary to a fall last night.  Discussed x-ray findings patient consistent with a proximal fibular fracture and ankle sprain.  Patient placed in knee immobilizer and given crutches for ambulation.  Patient advised follow orthopedic by calling for an appointment today for definitive evaluation and treatment.  Take medication as directed.     [name] was evaluated in Emergency Department on 01/22/2019 for the symptoms described in the history of present illness. He was evaluated in the context of the global COVID-19 pandemic, which necessitated consideration that the patient might be at risk for infection with the SARS-CoV-2 virus that causes COVID-19. Institutional protocols and algorithms that pertain to the evaluation of patients at risk for COVID-19 are in a state of rapid change based on information released by regulatory bodies including the CDC and federal and state organizations. These policies and algorithms were followed during the patient's care in the ED.       ____________________________________________   FINAL CLINICAL IMPRESSION(S) / ED DIAGNOSES  Final diagnoses:  Closed fracture of shaft of right fibula, unspecified fracture morphology, initial encounter  Sprain of right ankle, unspecified ligament, initial encounter     ED Discharge Orders         Ordered    traMADol (ULTRAM) 50 MG tablet  Every 6 hours PRN     01/22/19 1037    ibuprofen (ADVIL) 600 MG tablet  Every 8 hours PRN     01/22/19 1037  Note:  This document was prepared using Dragon voice recognition software and may include unintentional dictation errors.    Sable Feil, PA-C 01/22/19 1040    Lavonia Drafts, MD 01/22/19 1224

## 2019-01-22 NOTE — Discharge Instructions (Addendum)
Follow discharge care instruction.  Wear splint ambulate with crutches.evaluation by orthopedics.

## 2020-03-02 IMAGING — DX DG ANKLE COMPLETE 3+V*R*
3 series · 3 of 3 positions shown · non-contrast
Comparison: None.

CLINICAL DATA: Pain secondary to fall

EXAM:
RIGHT ANKLE - COMPLETE 3+ VIEW

[ankle obl]
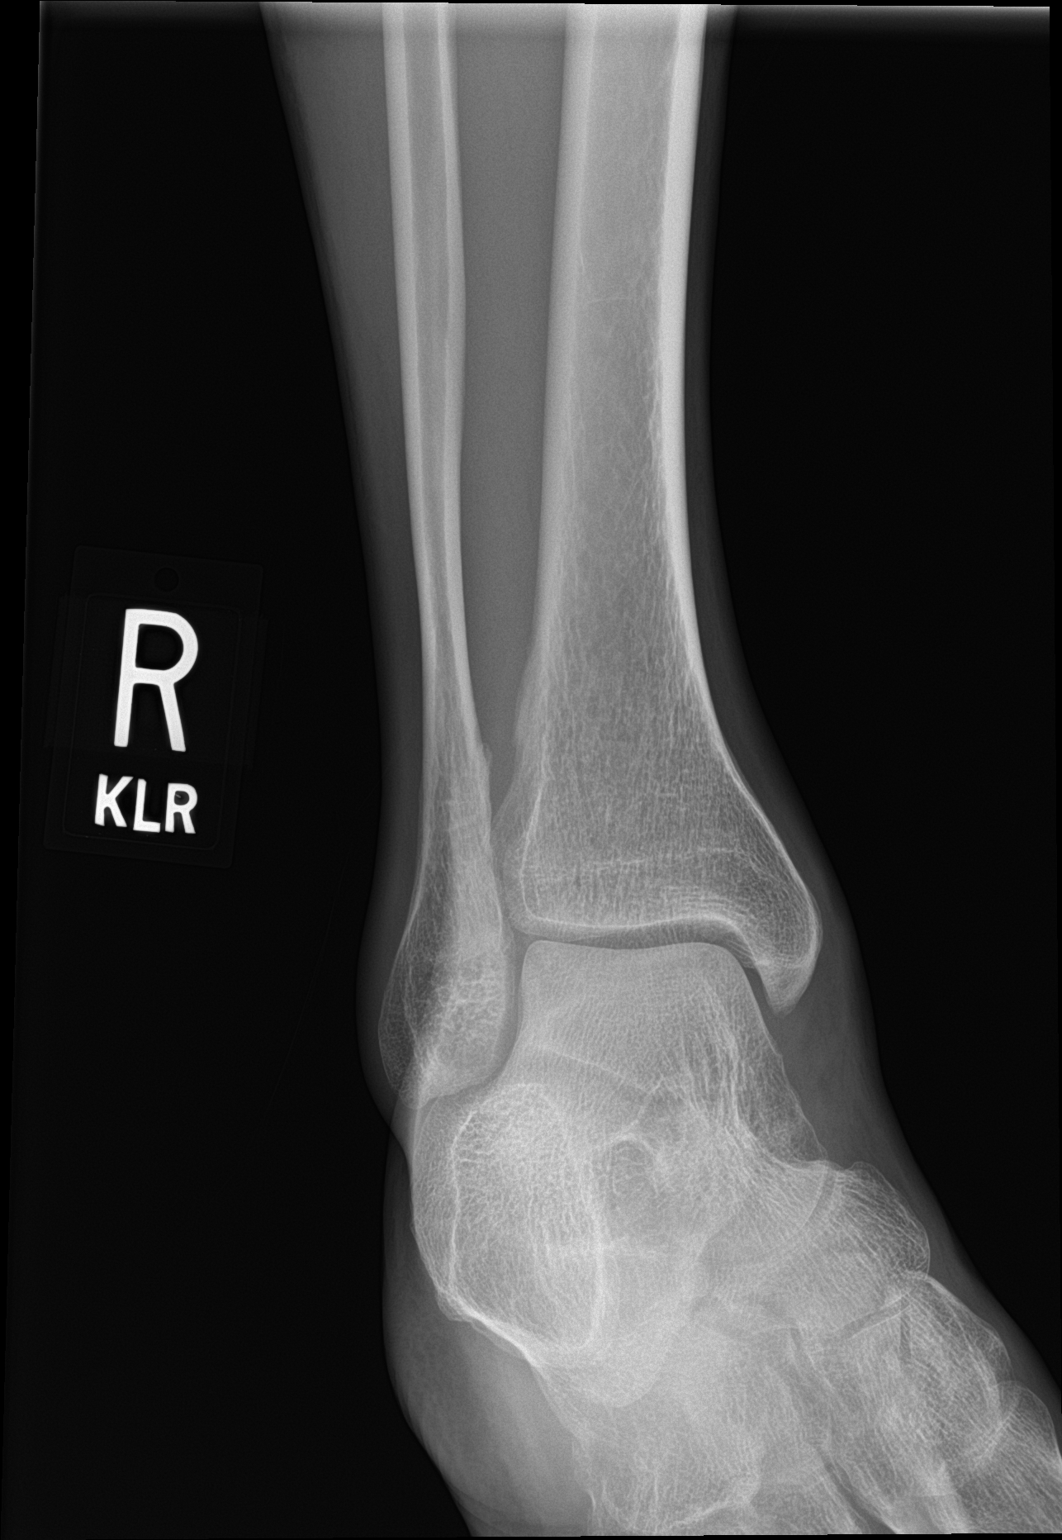

[ankle ap]
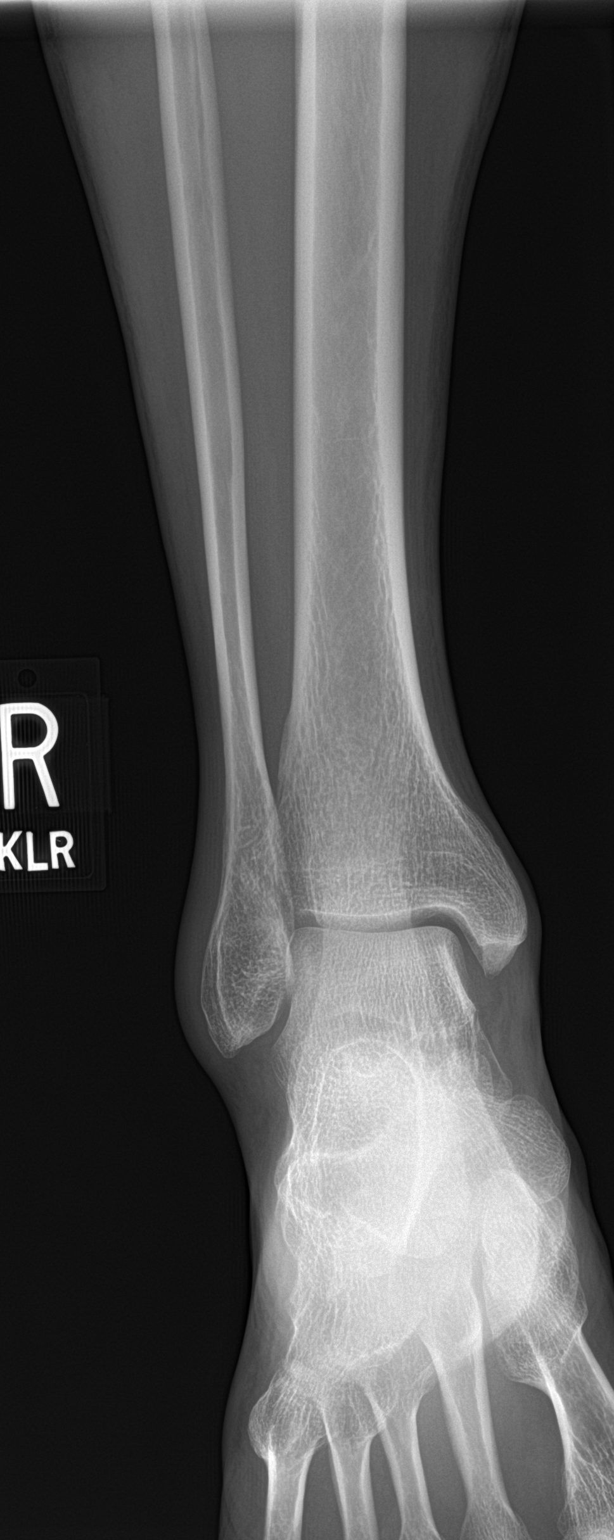

[ankle lat]
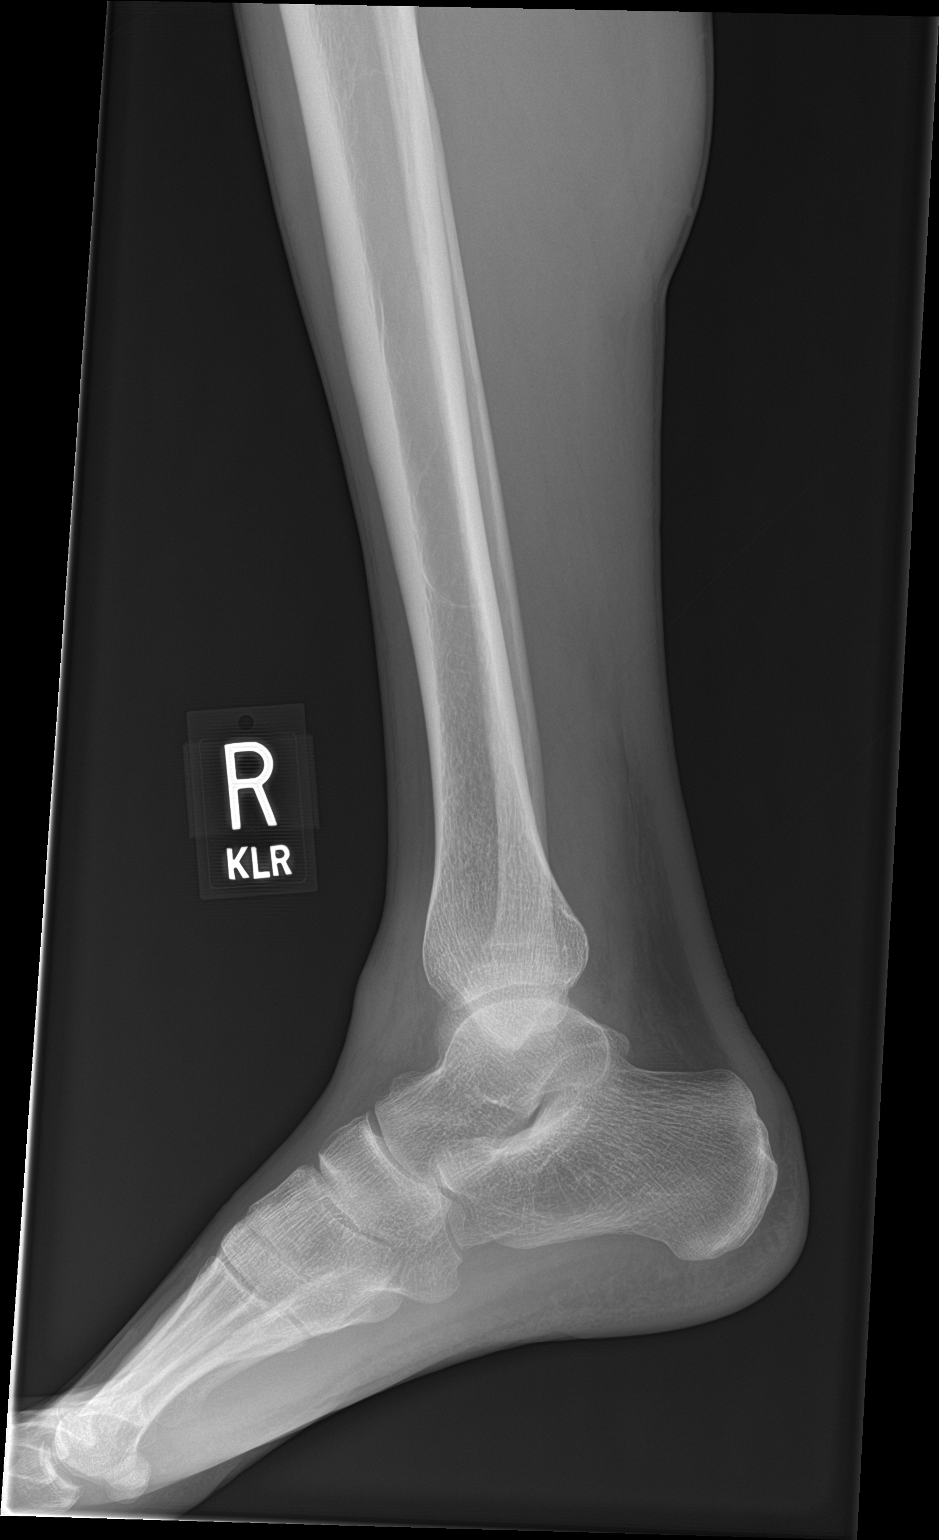

[3 of 3 positions shown; findings below may reference images not displayed]

FINDINGS: There is no evidence of fracture, dislocation, or joint effusion.
There is no evidence of arthropathy or other focal bone abnormality.
Mild soft tissue swelling seen over the lateral malleolus. Small
ankle joint effusion is seen.
IMPRESSION: No acute osseous abnormality.

## 2023-07-27 NOTE — Patient Instructions (Signed)

## 2023-07-29 ENCOUNTER — Ambulatory Visit (INDEPENDENT_AMBULATORY_CARE_PROVIDER_SITE_OTHER): Payer: Self-pay | Admitting: Nurse Practitioner

## 2023-07-29 ENCOUNTER — Encounter: Payer: Self-pay | Admitting: Nurse Practitioner

## 2023-07-29 VITALS — BP 121/75 | HR 53 | Temp 97.7°F | Ht 68.2 in | Wt 146.6 lb

## 2023-07-29 DIAGNOSIS — Z1159 Encounter for screening for other viral diseases: Secondary | ICD-10-CM

## 2023-07-29 DIAGNOSIS — J449 Chronic obstructive pulmonary disease, unspecified: Secondary | ICD-10-CM

## 2023-07-29 DIAGNOSIS — L989 Disorder of the skin and subcutaneous tissue, unspecified: Secondary | ICD-10-CM | POA: Diagnosis not present

## 2023-07-29 DIAGNOSIS — R110 Nausea: Secondary | ICD-10-CM

## 2023-07-29 DIAGNOSIS — F32A Depression, unspecified: Secondary | ICD-10-CM

## 2023-07-29 DIAGNOSIS — Z114 Encounter for screening for human immunodeficiency virus [HIV]: Secondary | ICD-10-CM

## 2023-07-29 DIAGNOSIS — F17210 Nicotine dependence, cigarettes, uncomplicated: Secondary | ICD-10-CM

## 2023-07-29 DIAGNOSIS — Z550 Illiteracy and low-level literacy: Secondary | ICD-10-CM

## 2023-07-29 DIAGNOSIS — E440 Moderate protein-calorie malnutrition: Secondary | ICD-10-CM

## 2023-07-29 DIAGNOSIS — F1290 Cannabis use, unspecified, uncomplicated: Secondary | ICD-10-CM

## 2023-07-29 DIAGNOSIS — F419 Anxiety disorder, unspecified: Secondary | ICD-10-CM | POA: Insufficient documentation

## 2023-07-29 DIAGNOSIS — N400 Enlarged prostate without lower urinary tract symptoms: Secondary | ICD-10-CM

## 2023-07-29 DIAGNOSIS — Z1211 Encounter for screening for malignant neoplasm of colon: Secondary | ICD-10-CM

## 2023-07-29 DIAGNOSIS — Z13220 Encounter for screening for lipoid disorders: Secondary | ICD-10-CM

## 2023-07-29 NOTE — Assessment & Plan Note (Signed)
 Ongoing, more related to state of world and people.  Denies SI/HI.  Declines medications.  Will monitor closely and initiate medication as needed.

## 2023-07-29 NOTE — Progress Notes (Signed)
 New Patient Office Visit  Subjective    Patient ID: Philip Rice, male    DOB: Jan 18, 1961  Age: 63 y.o. MRN: [id number]  CC:  Chief Complaint  Patient presents with   Rash    Patient states he has had a rash/red bumps on his stomach for the last 3 years. States the area does not itch, it's just there.     HPI [name] presents for new patient visit to establish care.  Introduced to Publishing rights manager role and practice setting.  All questions answered.  Discussed provider/patient relationship and expectations.  Has not seen provider since he was a child.    Is a current every day smoker, <1 PPD.  Has smoked since he was a teenager, smoked pot then too.  Has tried to quit, but this causes irritability. Does struggle with reading and writing, was in special ed classes when younger.  Stopped school in 10th Grade.  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  146 lbs landed on abdomen.  Had reassuring GI work-up after that. Uses marijuana for nausea and chronic abdominal pain.  Status: stable Treatments attempted: marijuana Fever: no Nausea: yes Vomiting: no Weight loss: no Decreased appetite: no Diarrhea: no Constipation: yes -- goes to bathroom once a week, does strain on occasion, did try OTC medications in past but this caused diarrhea Blood in stool: no Heartburn: no Jaundice: no Rash: no Dysuria/urinary frequency: no Hematuria: no History of sexually transmitted disease: no Recurrent NSAID use: no   RASH Was working under cedar tree and there was a big vine there, started to have rash after this. Since then has had skin changes to areas, for 3 years. Does enjoy going out into sun. Duration:  chronic  Location: abdomen and facial area  Itching: no Burning: no Redness: no Oozing: no Scaling: no Blisters: no Painful: no Fevers: no Change in  detergents/soaps/personal care products: no Recent illness: no Recent travel:no History of same: no Context: stable Alleviating factors: nothing Treatments attempted:lotion/moisturizer Shortness of breath: no  Throat/tongue swelling: no Myalgias/arthralgias: no   Depends on who he is driving benefit and someone's ignorance what his mood is.    07/29/2023   10:16 AM  Depression screen PHQ 2/9  Decreased Interest 2  Down, Depressed, Hopeless 2  PHQ - 2 Score 4  Altered sleeping 0  Tired, decreased energy 3  Change in appetite 0  Feeling bad or failure about yourself  0  Trouble concentrating 3  Moving slowly or fidgety/restless 0  Suicidal thoughts 0  PHQ-9 Score 10  Difficult doing work/chores Somewhat difficult       07/29/2023   10:16 AM  GAD 7 : Generalized Anxiety Score  Nervous, Anxious, on Edge 1  Control/stop worrying 3  Worry too much - different things 3  Trouble relaxing 3  Restless 3  Easily annoyed or irritable 3  Afraid - awful might happen 1  Total GAD 7 Score 17  Anxiety Difficulty Somewhat difficult    Outpatient Encounter Medications as of 07/29/2023  Medication Sig   [DISCONTINUED] feeding supplement, ENSURE ENLIVE, (ENSURE ENLIVE) LIQD Take 237 mLs by mouth 2 (two) times daily between meals.   [DISCONTINUED] ibuprofen  (ADVIL ) 600 MG tablet Take 1 tablet (600 mg total) by mouth every 8 (eight) hours as needed.   [DISCONTINUED] Multiple Vitamin (MULTIVITAMIN WITH MINERALS) TABS tablet Take 1  tablet by mouth daily.   No facility-administered encounter medications on file as of 07/29/2023.    Past Medical History:  Diagnosis Date   Emphysema lung (HCC)    patient denies    Past Surgical History:  Procedure Laterality Date   TONSILLECTOMY AND ADENOIDECTOMY      Family History  Problem Relation Age of Onset   Leukemia Mother    COPD Father        Had Covid   Aneurysm Maternal Grandmother    Cancer Maternal Grandfather     Social History    Socioeconomic History   Marital status: Single    Spouse name: Not on file   Number of children: 2   Years of education: Not on file   Highest education level: Not on file  Occupational History   Not on file  Tobacco Use   Smoking status: Every Day    Current packs/day: 1.00    Types: Cigarettes   Smokeless tobacco: Former  Building services engineer status: Never Used  Substance and Sexual Activity   Alcohol use: No   Drug use: Yes    Types: Marijuana    Comment: daily use   Sexual activity: Not Currently  Other Topics Concern   Not on file  Social History Narrative   Not on file   Social Drivers of Health   Financial Resource Strain: Low Risk  (07/29/2023)   Overall Financial Resource Strain (CARDIA)    Difficulty of Paying Living Expenses: Not hard at all  Food Insecurity: No Food Insecurity (07/29/2023)   Hunger Vital Sign    Worried About Running Out of Food in the Last Year: Never true    Ran Out of Food in the Last Year: Never true  Transportation Needs: No Transportation Needs (07/29/2023)   PRAPARE - Administrator, Civil Service (Medical): No    Lack of Transportation (Non-Medical): No  Physical Activity: Insufficiently Active (07/29/2023)   Exercise Vital Sign    Days of Exercise per Week: 4 days    Minutes of Exercise per Session: 30 min  Stress: No Stress Concern Present (07/29/2023)   [employer] of Occupational Health - Occupational Stress Questionnaire    Feeling of Stress : Only a little  Social Connections: Socially Isolated (07/29/2023)   Social Connection and Isolation Panel [NHANES]    Frequency of Communication with Friends and Family: More than three times a week    Frequency of Social Gatherings with Friends and Family: More than three times a week    Attends Religious Services: Never    Database administrator or Organizations: No    Attends Banker Meetings: Never    Marital Status: Divorced  Catering manager  Violence: Not At Risk (07/29/2023)   Humiliation, Afraid, Rape, and Kick questionnaire    Fear of Current or Ex-Partner: No    Emotionally Abused: No    Physically Abused: No    Sexually Abused: No    ROS      Objective    BP 121/75   Pulse (!) 53   Temp 97.7 F (36.5 C) (Oral)   Ht 5' 8.2" (1.732 m)   Wt 146 lb 9.6 oz (66.5 kg)   SpO2 98%   BMI 22.16 kg/m   Physical Exam Vitals and nursing note reviewed.  Constitutional:      General: He is awake. He is not in acute distress.    Appearance: He is well-developed, well-groomed  and underweight. He is not ill-appearing or toxic-appearing.  HENT:     Head: Normocephalic.     Right Ear: Hearing and external ear normal.     Left Ear: Hearing and external ear normal.  Eyes:     General: Lids are normal.     Extraocular Movements: Extraocular movements intact.     Conjunctiva/sclera: Conjunctivae normal.  Neck:     Thyroid: No thyromegaly.     Vascular: No carotid bruit.  Cardiovascular:     Rate and Rhythm: Normal rate and regular rhythm.     Heart sounds: Normal heart sounds. No murmur heard.    No gallop.  Pulmonary:     Effort: No accessory muscle usage or respiratory distress.     Breath sounds: Normal breath sounds.  Abdominal:     General: Bowel sounds are normal. There is no distension.     Palpations: Abdomen is soft.     Tenderness: There is no abdominal tenderness.  Musculoskeletal:     Cervical back: Full passive range of motion without pain.     Right lower leg: No edema.     Left lower leg: No edema.  Lymphadenopathy:     Cervical: No cervical adenopathy.  Skin:    General: Skin is warm.     Capillary Refill: Capillary refill takes less than 2 seconds.          Comments: Old scar marks to abdominal area, small and light brown.  Skin intact.    Neurological:     Mental Status: He is alert and oriented to person, place, and time.     Deep Tendon Reflexes: Reflexes are normal and symmetric.      Reflex Scores:      Brachioradialis reflexes are 2+ on the right side and 2+ on the left side.      Patellar reflexes are 2+ on the right side and 2+ on the left side. Psychiatric:        Attention and Perception: Attention normal.        Mood and Affect: Mood normal.        Speech: Speech normal.        Behavior: Behavior normal. Behavior is cooperative.        Thought Content: Thought content normal.    Last CBC Lab Results  Component Value Date   WBC 11.5 (H) 05/11/2017   HGB 13.9 05/11/2017   HCT 41.8 05/11/2017   MCV 88.6 05/11/2017   MCH 29.5 05/11/2017   RDW 12.6 05/11/2017   PLT 396 05/11/2017   Last metabolic panel Lab Results  Component Value Date   GLUCOSE 146 (H) 05/11/2017   NA 140 05/11/2017   K 3.6 05/11/2017   CL 107 05/11/2017   CO2 23 05/11/2017   BUN 19 05/11/2017   CREATININE 0.76 05/11/2017   GFRNONAA >60 05/11/2017   CALCIUM 8.7 (L) 05/11/2017   PROT 6.8 01/08/2016   ALBUMIN 4.1 01/08/2016   BILITOT 0.3 01/08/2016   ALKPHOS 60 01/08/2016   AST 18 01/08/2016   ALT 13 (L) 01/08/2016   ANIONGAP 10 05/11/2017        Assessment & Plan:   Problem List Items Addressed This Visit       Respiratory   Chronic obstructive pulmonary disease (HCC) - Primary   Chronic, suspect emphysema present.  Long time smoker.  Referral for lung screening after discussion with patient.  He is interested in this. Recommend complete cessation of smoking.  Consider  inhalers in future as needed.      Relevant Orders   CBC with Differential/Platelet   TSH     Musculoskeletal and Integument   Skin lesions   To various areas - left side of face and right upper scalp area.  Suspicious in nature.  Sits in sun often. Will place referral to dermatology for further assessment, which he agrees with. - has old scar marks to abdomen, history of MRSA and he reports getting occasional blocked hair follicles to abdomen that he pops, suspect scar marks are from this and advised  him not to manipulate areas.      Relevant Orders   Ambulatory referral to Dermatology     Other   Nicotine  dependence, cigarettes, uncomplicated   I have recommended complete cessation of tobacco use. I have discussed various options available for assistance with tobacco cessation including over the counter methods (Nicotine  gum, patch and lozenges). We also discussed prescription options (Chantix, Nicotine  Inhaler / Nasal Spray). The patient is not interested in pursuing any prescription tobacco cessation options at this time.       Relevant Orders   Ambulatory Referral Lung Cancer Screening [facility]   Marijuana use, continuous   Chronic, has used since teen years and currently gets benefit with this and his nausea.  Recommend cut back to cessation.  Could try other things for symptom management, but reports he tried things in past and they did not help.      Malnutrition of moderate degree   Ongoing.  Recommend he focus on supplement intake at home, Ensure or Premiere Protein three servings daily.  Check labs today and monitor weight closely.      Low-level of literacy   Went to 10th Grade and reports being in special ed classes.  Endorses difficulty with reading and writing at baseline.      Chronic nausea   Ongoing since his abdominal injury in 2013.  He smokes MJ daily to help with this.  Had full GI work-up in past on review which was reassuring.  Has tried multiple OTC medications for constipation, but reports these then cause diarrhea so he currently is stable with bowel regimen per his report.  Referral for colonoscopy, which is due.  Labs today.      Anxiety and depression   Ongoing, more related to state of world and people.  Denies SI/HI.  Declines medications.  Will monitor closely and initiate medication as needed.      Other Visit Diagnoses       Benign prostatic hyperplasia without lower urinary tract symptoms       PSA on labs today.   Relevant Orders    PSA     Encounter for lipid screening for cardiovascular disease       Lipid panel on labs today, has not ate.   Relevant Orders   Comprehensive metabolic panel with GFR   Lipid Panel w/o Chol/HDL Ratio     Need for hepatitis C screening test       Hep C screening on labs today, discussed this with patient.   Relevant Orders   Hepatitis C antibody     Encounter for screening for HIV       HIV screening on labs today, discussed this with patient.   Relevant Orders   HIV Antibody (routine testing w rflx)     Colon cancer screening       Referral to GI placed after discussion with patient, due for screening.  Relevant Orders   Ambulatory referral to Gastroenterology       Return in about 4 weeks (around 08/26/2023) for COPD.   Natilee Gauer T Jaylenn Altier, NP

## 2023-07-29 NOTE — Assessment & Plan Note (Signed)
 Ongoing since his abdominal injury in 2013.  He smokes MJ daily to help with this.  Had full GI work-up in past on review which was reassuring.  Has tried multiple OTC medications for constipation, but reports these then cause diarrhea so he currently is stable with bowel regimen per his report.  Referral for colonoscopy, which is due.  Labs today.

## 2023-07-29 NOTE — Assessment & Plan Note (Signed)
 Ongoing.  Recommend he focus on supplement intake at home, Ensure or Premiere Protein three servings daily.  Check labs today and monitor weight closely.

## 2023-07-29 NOTE — Assessment & Plan Note (Signed)
 I have recommended complete cessation of tobacco use. I have discussed various options available for assistance with tobacco cessation including over the counter methods (Nicotine gum, patch and lozenges). We also discussed prescription options (Chantix, Nicotine Inhaler / Nasal Spray). The patient is not interested in pursuing any prescription tobacco cessation options at this time.

## 2023-07-29 NOTE — Assessment & Plan Note (Signed)
 Chronic, has used since teen years and currently gets benefit with this and his nausea.  Recommend cut back to cessation.  Could try other things for symptom management, but reports he tried things in past and they did not help.

## 2023-07-29 NOTE — Assessment & Plan Note (Signed)
 Chronic, suspect emphysema present.  Long time smoker.  Referral for lung screening after discussion with patient.  He is interested in this. Recommend complete cessation of smoking.  Consider inhalers in future as needed.

## 2023-07-29 NOTE — Assessment & Plan Note (Addendum)
 To various areas - left side of face and right upper scalp area.  Suspicious in nature.  Sits in sun often. Will place referral to dermatology for further assessment, which he agrees with. - has old scar marks to abdomen, history of MRSA and he reports getting occasional blocked hair follicles to abdomen that he pops, suspect scar marks are from this and advised him not to manipulate areas.

## 2023-07-29 NOTE — Assessment & Plan Note (Signed)
 Went to 10th Grade and reports being in special ed classes.  Endorses difficulty with reading and writing at baseline.

## 2023-07-30 ENCOUNTER — Ambulatory Visit: Payer: Self-pay | Admitting: Nurse Practitioner

## 2023-07-30 ENCOUNTER — Ambulatory Visit: Payer: Self-pay

## 2023-07-30 MED ORDER — ROSUVASTATIN CALCIUM 10 MG PO TABS: 10.0000 mg | ORAL_TABLET | Freq: Every evening | ORAL | 3 refills | Status: DC

## 2023-07-30 NOTE — Telephone Encounter (Signed)
 Copied from CRM [id number]. Topic: Clinical - Medication Question >> Jul 30, 2023  4:01 PM Danna Duster wrote: Reason for CRM: In response to Jolene Cannady's message on 07/30/23, after I read the message to the Patient, he willing to try the Rosuvastatin medication as a trial.  Please submit a Rx order to [facility] in [place].  Also please provide resources on other ways LDL can be lowered, as patient was not completely sure what caused bad cholesterol levels to increase.

## 2023-07-30 NOTE — Telephone Encounter (Signed)
 Pt had returned call, reviewed message and advised E2C2 Agent pt didn't need triaging for this and I would send message to provider about pt being willing to try medication. If pt has futher questions he can FU with office.

## 2023-07-30 NOTE — Telephone Encounter (Signed)
 Received call from specialist for patient returning call, patient disconnected prior to transfer. Attempted to reach patient but no answer, left message to return call to office. Upon chart review it appears message has already been sent to provider by another nurse re: Rosuvastatin. Please follow up with patient.  Reason for Disposition . [1] Caller requests to speak ONLY to PCP AND [2] NON-URGENT question  Protocols used: PCP Call - No Triage-A-AH

## 2023-07-30 NOTE — Telephone Encounter (Signed)
 Answered pt questions. Pt would like to start a statin for LDL.

## 2023-07-30 NOTE — Progress Notes (Signed)
 Good afternoon, please let Kass know his labs have returned except for one which is still pending and if abnormal will alert him.  Overall his labs are stable.  Prostate and thyroid labs normal. Kidney and liver function are normal.  LDL, bad cholesterol, is elevated.  I would recommend starting statin therapy (medication) to help lower this and lower your risk for stroke or heart attack.  Main side effects I see with these are fatigue and muscle aches, but majority of patients tolerate well.  Do you want to trial one of these and see how to tolerate? I often start with Rosuvastatin.  Any questions? Keep being awesome!!  Thank you for allowing me to participate in your care.  I appreciate you. Kindest regards, Keldan Eplin

## 2023-07-30 NOTE — Addendum Note (Signed)
 Addended by: Ceylon Arenson T on: 07/30/2023 04:39 PM   Modules accepted: Orders

## 2023-08-01 LAB — LIPID PANEL W/O CHOL/HDL RATIO
Cholesterol, Total: 168 mg/dL (ref 100–199)
HDL: 46 mg/dL (ref >39)
LDL Chol Calc (NIH): 112 mg/dL — ABNORMAL HIGH (ref 0–99)
Triglycerides: 51 mg/dL (ref 0–149)
VLDL Cholesterol Cal: 10 mg/dL (ref 5–40)

## 2023-08-01 LAB — CBC WITH DIFFERENTIAL/PLATELET
Basophils Absolute: 0.1 10*3/uL (ref 0.0–0.2)
Basos: 1 %
EOS (ABSOLUTE): 0.2 10*3/uL (ref 0.0–0.4)
Eos: 2 %
Hematocrit: 47.7 % (ref 37.5–51.0)
Hemoglobin: 15.8 g/dL (ref 13.0–17.7)
Immature Grans (Abs): 0 10*3/uL (ref 0.0–0.1)
Immature Granulocytes: 0 %
Lymphocytes Absolute: 1.7 10*3/uL (ref 0.7–3.1)
Lymphs: 25 %
MCH: 30.3 pg (ref 26.6–33.0)
MCHC: 33.1 g/dL (ref 31.5–35.7)
MCV: 92 fL (ref 79–97)
Monocytes Absolute: 0.7 10*3/uL (ref 0.1–0.9)
Monocytes: 10 %
Neutrophils Absolute: 4.2 10*3/uL (ref 1.4–7.0)
Neutrophils: 62 %
Platelets: 353 10*3/uL (ref 150–450)
RBC: 5.21 x10E6/uL (ref 4.14–5.80)
RDW: 11.8 % (ref 11.6–15.4)
WBC: 6.8 10*3/uL (ref 3.4–10.8)

## 2023-08-01 LAB — TSH: TSH: 1.58 u[IU]/mL (ref 0.450–4.500)

## 2023-08-01 LAB — COMPREHENSIVE METABOLIC PANEL WITH GFR
ALT: 25 IU/L (ref 0–44)
AST: 21 IU/L (ref 0–40)
Albumin: 4.6 g/dL (ref 3.9–4.9)
Alkaline Phosphatase: 65 IU/L (ref 44–121)
BUN/Creatinine Ratio: 11 (ref 10–24)
BUN: 11 mg/dL (ref 8–27)
Bilirubin Total: 0.4 mg/dL (ref 0.0–1.2)
CO2: 24 mmol/L (ref 20–29)
Calcium: 9.5 mg/dL (ref 8.6–10.2)
Chloride: 103 mmol/L (ref 96–106)
Creatinine, Ser: 1.03 mg/dL (ref 0.76–1.27)
Globulin, Total: 2.3 g/dL (ref 1.5–4.5)
Glucose: 81 mg/dL (ref 70–99)
Potassium: 4.9 mmol/L (ref 3.5–5.2)
Sodium: 141 mmol/L (ref 134–144)
Total Protein: 6.9 g/dL (ref 6.0–8.5)
eGFR: 82 mL/min/{1.73_m2} (ref >59)

## 2023-08-01 LAB — PSA: Prostate Specific Ag, Serum: 0.5 ng/mL (ref 0.0–4.0)

## 2023-08-01 LAB — HEPATITIS C ANTIBODY: Hep C Virus Ab: NONREACTIVE

## 2023-08-01 LAB — HIV ANTIBODY (ROUTINE TESTING W REFLEX)

## 2023-08-04 ENCOUNTER — Telehealth: Payer: Self-pay

## 2023-08-04 MED ORDER — ROSUVASTATIN CALCIUM 10 MG PO TABS: 10.0000 mg | ORAL_TABLET | Freq: Every evening | ORAL | 3 refills | Status: DC

## 2023-08-04 NOTE — Telephone Encounter (Signed)
 Called and notified patient that prescription has been resent to [facility] for him.

## 2023-08-04 NOTE — Telephone Encounter (Signed)
 Copied from CRM [id number]. Topic: Clinical - Prescription Issue >> Aug 04, 2023 12:58 PM Essie A wrote: Reason for CRM: Patient's prescription for rosuvastatin  (CRESTOR ) 10 MG tablet was sent to the wrong pharmacy, [facility].  Patient is willing to got across town to pick it up.  It should have been sent to [facility] on [address].

## 2023-08-04 NOTE — Telephone Encounter (Signed)
 Can RX be resent to [facility] for the patient?

## 2023-08-06 DIAGNOSIS — L281 Prurigo nodularis: Secondary | ICD-10-CM | POA: Diagnosis not present

## 2023-08-06 DIAGNOSIS — L249 Irritant contact dermatitis, unspecified cause: Secondary | ICD-10-CM | POA: Diagnosis not present

## 2023-08-06 DIAGNOSIS — L818 Other specified disorders of pigmentation: Secondary | ICD-10-CM | POA: Diagnosis not present

## 2023-08-06 DIAGNOSIS — L570 Actinic keratosis: Secondary | ICD-10-CM | POA: Diagnosis not present

## 2023-08-11 ENCOUNTER — Ambulatory Visit: Payer: Self-pay

## 2023-08-11 NOTE — Telephone Encounter (Addendum)
   1st attempt to call back patient went unanswered, left message to call back for triage.   Copied from CRM [id number]. Topic: Clinical - Medication Question >> Aug 11, 2023 10:42 AM Stanly Early wrote: Reason for CRM: rosuvastatin  (CRESTOR ) 10 MG tablet,  patient has a few question. He hasn't been to the bathroom, he feels like he's dehydrated.    [phone])

## 2023-08-11 NOTE — Telephone Encounter (Signed)
 FYI Only or Action Required?: FYI only for provider  Patient was last seen in primary care on 07/29/2023 by Cannady, Jolene T, NP. Called Nurse Triage reporting Constipation. Symptoms began chronic. Interventions attempted: OTC medications: colace, miralax, Rest, hydration, or home remedies, and Dietary changes. Symptoms are: rapidly improving.  Triage Disposition: Home Care  Patient/caregiver understands and will follow disposition?: yes   Reason for Disposition  MILD constipation  Answer Assessment - Initial Assessment Questions 1. STOOL PATTERN OR FREQUENCY: "How often do you have a bowel movement (BM)?"  (Normal range: 3 times a day to every 3 days)  "When was your last BM?"       Had been 6 days prior to call back, has had large BM since. All symptoms now resolved 2. STRAINING: "Do you have to strain to have a BM?"      Tries not to strain, thinks this contributes to contstipation 3. RECTAL PAIN: "Does your rectum hurt when the stool comes out?" If Yes, ask: "Do you have hemorrhoids? How bad is the pain?"  (Scale 1-10; or mild, moderate, severe)     NA 4. STOOL COMPOSITION: "Are the stools hard?"      varies 5. BLOOD ON STOOLS: "Has there been any blood on the toilet tissue or on the surface of the BM?" If Yes, ask: "When was the last time?"     no 6. CHRONIC CONSTIPATION: "Is this a new problem for you?"  If No, ask: "How long have you had this problem?" (days, weeks, months)      Chronic  7. CHANGES IN DIET OR HYDRATION: "Have there been any recent changes in your diet?" "How much fluids are you drinking on a daily basis?"  "How much have you had to drink today?"     Increased fluids and fiber 8. MEDICINES: "Have you been taking any new medicines?" "Are you taking any narcotic pain medicines?" (e.g., Dilaudid, morphine, Percocet, Vicodin)     Crestor  9. LAXATIVES: "Have you been using any stool softeners, laxatives, or enemas?"  If Yes, ask "What, how often, and when was the last  time?"     Colace intermittently, Miralax intermittently.    Additional info: Had BM prior to call back and now all symptoms have resolved.  Protocols used: Constipation-A-AH

## 2023-08-23 NOTE — Patient Instructions (Signed)
 COPD and Physical Activity Chronic obstructive pulmonary disease (COPD) is a long-term, or chronic, condition that affects the lungs. COPD is a general term that can be used to describe many problems that cause inflammation of the lungs and limit airflow. These conditions include chronic bronchitis and emphysema. The main symptom of COPD is shortness of breath, which makes it harder to do even simple tasks. This can also make it harder to exercise and stay active. Talk with your health care provider about treatments to help you breathe better and actions you can take to prevent breathing problems during physical activity. What are the benefits of exercising when you have COPD? Exercising regularly is an important part of a healthy lifestyle. You can still exercise and do physical activities even though you have COPD. Exercise and physical activity improve your shortness of breath by increasing blood flow (circulation). This causes your heart to pump more oxygen through your body. Moderate exercise can: Improve oxygen use. Increase your energy level. Help with shortness of breath. Strengthen your breathing muscles. Improve heart health. Help with sleep. Improve your self-esteem and feelings of self-worth. Lower depression, stress, and anxiety. Exercise can benefit everyone with COPD. The severity of your disease may affect how hard you can exercise, especially at first, but everyone can benefit. Talk with your health care provider about how much exercise is safe for you, and which activities and exercises are safe for you. What actions can I take to prevent breathing problems during physical activity? Sign up for a pulmonary rehabilitation program. This type of program may include: Education about lung diseases. Exercise classes that teach you how to exercise and be more active while improving your breathing. This usually involves: Exercise using your lower extremities, such as a stationary  bicycle. About 30 minutes of exercise, 2 to 5 times per week, for 6 to 12 weeks. Strength training, such as push-ups or leg lifts. Nutrition education. Group classes in which you can talk with others who also have COPD and learn ways to manage stress. If you use an oxygen tank, you should use it while you exercise. Work with your health care provider to adjust your oxygen for your physical activity. Your resting flow rate is different from your flow rate during physical activity. How to manage your breathing while exercising While you are exercising: Take slow breaths. Pace yourself, and do nottry to go too fast. Purse your lips while breathing out. Pursing your lips is similar to a kissing or whistling position. If doing exercise that uses a quick burst of effort, such as weight lifting: Breathe in before starting the exercise. Breathe out during the hardest part of the exercise, such as raising the weights. Where to find support You can find support for exercising with COPD from: Your health care provider. A pulmonary rehabilitation program. Your local health department or community health programs. Support groups, either online or in-person. Your health care provider may be able to recommend support groups. Where to find more information You can find more information about exercising with COPD from: American Lung Association: lung.org COPD Foundation: copdfoundation.org Contact a health care provider if: Your symptoms get worse. You have nausea. You have a fever. You want to start a new exercise program or a new activity. Get help right away if: You have chest pain. You cannot breathe. These symptoms may represent a serious problem that is an emergency. Do not wait to see if the symptoms will go away. Get medical help right away. Call  your local emergency services (911 in the [place] Do not drive yourself to the hospital. Summary COPD is a general term that can be used to describe  many different lung problems that cause lung inflammation and limit airflow. This includes chronic bronchitis and emphysema. Exercise and physical activity improve your shortness of breath by increasing blood flow (circulation). This causes your heart to provide more oxygen to your body. Contact your health care provider before starting any exercise program or new activity. Ask your health care provider what exercises and activities are safe for you. This information is not intended to replace advice given to you by your health care provider. Make sure you discuss any questions you have with your health care provider. Document Revised: 01/03/2023 Document Reviewed: 01/03/2023 Elsevier Patient Education  2024 [employer].

## 2023-08-26 ENCOUNTER — Ambulatory Visit (INDEPENDENT_AMBULATORY_CARE_PROVIDER_SITE_OTHER): Admitting: Nurse Practitioner

## 2023-08-26 ENCOUNTER — Encounter: Payer: Self-pay | Admitting: Nurse Practitioner

## 2023-08-26 VITALS — BP 123/77 | HR 62 | Temp 97.8°F | Wt 143.8 lb

## 2023-08-26 DIAGNOSIS — R220 Localized swelling, mass and lump, head: Secondary | ICD-10-CM | POA: Diagnosis not present

## 2023-08-26 DIAGNOSIS — F17210 Nicotine dependence, cigarettes, uncomplicated: Secondary | ICD-10-CM

## 2023-08-26 DIAGNOSIS — Z1211 Encounter for screening for malignant neoplasm of colon: Secondary | ICD-10-CM

## 2023-08-26 DIAGNOSIS — E782 Mixed hyperlipidemia: Secondary | ICD-10-CM | POA: Diagnosis not present

## 2023-08-26 DIAGNOSIS — J449 Chronic obstructive pulmonary disease, unspecified: Secondary | ICD-10-CM

## 2023-08-26 MED ORDER — ROSUVASTATIN CALCIUM 10 MG PO TABS: 10.0000 mg | ORAL_TABLET | ORAL | Status: DC

## 2023-08-26 MED ORDER — ALBUTEROL SULFATE HFA 108 (90 BASE) MCG/ACT IN AERS: 2.0000 | INHALATION_SPRAY | Freq: Four times a day (QID) | RESPIRATORY_TRACT | 3 refills | Status: DC | PRN

## 2023-08-26 NOTE — Assessment & Plan Note (Signed)
 I have recommended complete cessation of tobacco use. I have discussed various options available for assistance with tobacco cessation including over the counter methods (Nicotine gum, patch and lozenges). We also discussed prescription options (Chantix, Nicotine Inhaler / Nasal Spray). The patient is not interested in pursuing any prescription tobacco cessation options at this time.

## 2023-08-26 NOTE — Assessment & Plan Note (Signed)
 Chronic, ongoing.  Will be scheduling lung cancer screening upcoming.  Recommend complete cessation of smoking, he is working on this.  Spirometry noted mild to moderate lung changes.  Will send in Albuterol  inhaler to use as needed to start and at next visit further discuss daily inhaler, which would benefit overall lung health.

## 2023-08-26 NOTE — Assessment & Plan Note (Signed)
 Ongoing for 3-4 years.  Will get him into ENT for further assessment of left nostril.  History of trauma to this nostril.

## 2023-08-26 NOTE — Progress Notes (Signed)
 BP 123/77   Pulse 62   Temp 97.8 F (36.6 C) (Oral)   Wt 143 lb 12.8 oz (65.2 kg)   SpO2 97%   BMI 21.74 kg/m    Subjective:    Patient ID: Philip Rice, male    DOB: 18-Jan-1961, 63 y.o.   MRN: [id number]  HPI: Philip Rice is a 63 y.o. male  Chief Complaint  Patient presents with   COPD   He tried Rosuvastatin  for HLD, but this caused constipation and knees started aching a lot. Noticed this after one week of taking daily. He has been working on diet changes.  About 3-4 years ago was working on battery terminal.  Breathed in and something sucked up into his nose and penetrated. He is having more discomfort now with this nostril (left).    COPD Follow-up today for lungs.  No current inhalers, has never used. 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  Got pneumonia.  Is a current every day smoker, 1/2 PPD.  Used to be 1 PPD.  Was a kid when started smoking. COPD status: stable Satisfied with current treatment?: yes Oxygen use: no Dyspnea frequency: occasional, not often Cough frequency: occasional Rescue inhaler frequency:  none Limitation of activity: no Productive cough: none Last Spirometry: 08/26/23 Pneumovax: will think about this Influenza: refused   Relevant past medical, surgical, family and social history reviewed and updated as indicated. Interim medical history since our last visit reviewed. Allergies and medications reviewed and updated.  Review of Systems  Constitutional:  Negative for activity change, diaphoresis, fatigue and fever.  Respiratory:  Negative for cough, chest tightness, shortness of breath and wheezing.   Cardiovascular:  Negative for chest pain, palpitations and leg swelling.  Gastrointestinal: Negative.   Neurological: Negative.   Psychiatric/Behavioral: Negative.      Per HPI unless specifically indicated above     Objective:    BP 123/77   Pulse 62   Temp 97.8 F  (36.6 C) (Oral)   Wt 143 lb 12.8 oz (65.2 kg)   SpO2 97%   BMI 21.74 kg/m   Wt Readings from Last 3 Encounters:  08/26/23 143 lb 12.8 oz (65.2 kg)  07/29/23 146 lb 9.6 oz (66.5 kg)  01/22/19 150 lb (68 kg)    Physical Exam Vitals and nursing note reviewed.  Constitutional:      General: He is awake. He is not in acute distress.    Appearance: He is well-developed, well-groomed and underweight. He is not ill-appearing or toxic-appearing.  HENT:     Head: Normocephalic.     Right Ear: Hearing, tympanic membrane, ear canal and external ear normal.     Left Ear: Hearing, tympanic membrane, ear canal and external ear normal.     Nose: Nose normal.     Right Turbinates: Not swollen.     Left Turbinates: Swollen.     Right Sinus: No maxillary sinus tenderness or frontal sinus tenderness.     Left Sinus: No maxillary sinus tenderness or frontal sinus tenderness.   Eyes:     General: Lids are normal.     Extraocular Movements: Extraocular movements intact.     Conjunctiva/sclera: Conjunctivae normal.   Neck:     Thyroid: No thyromegaly.     Vascular: No carotid bruit.   Cardiovascular:     Rate and Rhythm: Normal rate and regular rhythm.     Heart  sounds: Normal heart sounds. No murmur heard.    No gallop.  Pulmonary:     Effort: No accessory muscle usage or respiratory distress.     Breath sounds: Wheezing (occasional expiratory wheezes noted) present. No decreased breath sounds or rales.     Comments: Hoarse voice at baseline. Abdominal:     General: Bowel sounds are normal. There is no distension.     Palpations: Abdomen is soft.     Tenderness: There is no abdominal tenderness.   Musculoskeletal:     Cervical back: Full passive range of motion without pain.     Right lower leg: No edema.     Left lower leg: No edema.  Lymphadenopathy:     Cervical: No cervical adenopathy.   Skin:    General: Skin is warm.     Capillary Refill: Capillary refill takes less than 2  seconds.   Neurological:     Mental Status: He is alert and oriented to person, place, and time.     Deep Tendon Reflexes: Reflexes are normal and symmetric.     Reflex Scores:      Brachioradialis reflexes are 2+ on the right side and 2+ on the left side.      Patellar reflexes are 2+ on the right side and 2+ on the left side.  Psychiatric:        Attention and Perception: Attention normal.        Mood and Affect: Mood normal.        Speech: Speech normal.        Behavior: Behavior normal. Behavior is cooperative.        Thought Content: Thought content normal.     Results for orders placed or performed in visit on 07/29/23  CBC with Differential/Platelet   Collection Time: 07/29/23 10:50 AM  Result Value Ref Range   WBC 6.8 3.4 - 10.8 x10E3/uL   RBC 5.21 4.14 - 5.80 x10E6/uL   Hemoglobin 15.8 13.0 - 17.7 g/dL   Hematocrit 52.2 62.4 - 51.0 %   MCV 92 79 - 97 fL   MCH 30.3 26.6 - 33.0 pg   MCHC 33.1 31.5 - 35.7 g/dL   RDW 88.1 88.3 - 84.5 %   Platelets 353 150 - 450 x10E3/uL   Neutrophils 62 Not Estab. %   Lymphs 25 Not Estab. %   Monocytes 10 Not Estab. %   Eos 2 Not Estab. %   Basos 1 Not Estab. %   Neutrophils Absolute 4.2 1.4 - 7.0 x10E3/uL   Lymphocytes Absolute 1.7 0.7 - 3.1 x10E3/uL   Monocytes Absolute 0.7 0.1 - 0.9 x10E3/uL   EOS (ABSOLUTE) 0.2 0.0 - 0.4 x10E3/uL   Basophils Absolute 0.1 0.0 - 0.2 x10E3/uL   Immature Granulocytes 0 Not Estab. %   Immature Grans (Abs) 0.0 0.0 - 0.1 x10E3/uL  Comprehensive metabolic panel with GFR   Collection Time: 07/29/23 10:50 AM  Result Value Ref Range   Glucose 81 70 - 99 mg/dL   BUN 11 8 - 27 mg/dL   Creatinine, Ser 8.96 0.76 - 1.27 mg/dL   eGFR 82 >40 fO/fpw/8.26   BUN/Creatinine Ratio 11 10 - 24   Sodium 141 134 - 144 mmol/L   Potassium 4.9 3.5 - 5.2 mmol/L   Chloride 103 96 - 106 mmol/L   CO2 24 20 - 29 mmol/L   Calcium  9.5 8.6 - 10.2 mg/dL   Total Protein 6.9 6.0 - 8.5 g/dL   Albumin 4.6 3.9 -  4.9 g/dL    Globulin, Total 2.3 1.5 - 4.5 g/dL   Bilirubin Total 0.4 0.0 - 1.2 mg/dL   Alkaline Phosphatase 65 44 - 121 IU/L   AST 21 0 - 40 IU/L   ALT 25 0 - 44 IU/L  TSH   Collection Time: 07/29/23 10:50 AM  Result Value Ref Range   TSH 1.580 0.450 - 4.500 uIU/mL  PSA   Collection Time: 07/29/23 10:50 AM  Result Value Ref Range   Prostate Specific Ag, Serum 0.5 0.0 - 4.0 ng/mL  Lipid Panel w/o Chol/HDL Ratio   Collection Time: 07/29/23 10:50 AM  Result Value Ref Range   Cholesterol, Total 168 100 - 199 mg/dL   Triglycerides 51 0 - 149 mg/dL   HDL 46 >60 mg/dL   VLDL Cholesterol Cal 10 5 - 40 mg/dL   LDL Chol Calc (NIH) 887 (H) 0 - 99 mg/dL  Hepatitis C antibody   Collection Time: 07/29/23 10:50 AM  Result Value Ref Range   Hep C Virus Ab Non Reactive Non Reactive  HIV Antibody (routine testing w rflx)   Collection Time: 07/29/23 10:50 AM  Result Value Ref Range   HIV Screen 4th Generation wRfx Non Reactive Non Reactive      Assessment & Plan:   Problem List Items Addressed This Visit       Respiratory   Chronic obstructive pulmonary disease (HCC) - Primary   Chronic, ongoing.  Will be scheduling lung cancer screening upcoming.  Recommend complete cessation of smoking, he is working on this.  Spirometry noted mild to moderate lung changes.  Will send in Albuterol  inhaler to use as needed to start and at next visit further discuss daily inhaler, which would benefit overall lung health.      Relevant Medications   albuterol  (VENTOLIN  HFA) 108 (90 Base) MCG/ACT inhaler   Other Relevant Orders   Spirometry with Graph (Completed)     Other   Swelling of nose   Ongoing for 3-4 years.  Will get him into ENT for further assessment of left nostril.  History of trauma to this nostril.      Relevant Orders   Ambulatory referral to ENT   Nicotine  dependence, cigarettes, uncomplicated   I have recommended complete cessation of tobacco use. I have discussed various options available for  assistance with tobacco cessation including over the counter methods (Nicotine  gum, patch and lozenges). We also discussed prescription options (Chantix, Nicotine  Inhaler / Nasal Spray). The patient is not interested in pursuing any prescription tobacco cessation options at this time.       Mixed hyperlipidemia   Chronic, ongoing.  Did not tolerate daily Rosuvastatin , recommend he try taking weekly as would still get some benefit.  Discussed with him and he will try this.      Relevant Medications   rosuvastatin  (CRESTOR ) 10 MG tablet   Other Visit Diagnoses       Colon cancer screening       GI referral placed, discussed with patient.   Relevant Orders   Ambulatory referral to Gastroenterology        Follow up plan: Return in about 5 months (around 01/19/2024) for COPD, HLD, MOOD.

## 2023-08-26 NOTE — Assessment & Plan Note (Signed)
 Chronic, ongoing.  Did not tolerate daily Rosuvastatin , recommend he try taking weekly as would still get some benefit.  Discussed with him and he will try this.

## 2023-11-20 DIAGNOSIS — D485 Neoplasm of uncertain behavior of skin: Secondary | ICD-10-CM | POA: Diagnosis not present

## 2023-11-20 DIAGNOSIS — J310 Chronic rhinitis: Secondary | ICD-10-CM | POA: Diagnosis not present

## 2023-12-03 DIAGNOSIS — D210 Benign neoplasm of connective and other soft tissue of head, face and neck: Secondary | ICD-10-CM | POA: Diagnosis not present

## 2024-01-25 NOTE — Patient Instructions (Signed)

## 2024-01-27 ENCOUNTER — Encounter: Payer: Self-pay | Admitting: Emergency Medicine

## 2024-01-27 ENCOUNTER — Encounter: Payer: Self-pay | Admitting: Nurse Practitioner

## 2024-01-27 ENCOUNTER — Ambulatory Visit (INDEPENDENT_AMBULATORY_CARE_PROVIDER_SITE_OTHER): Admitting: Nurse Practitioner

## 2024-01-27 VITALS — BP 132/80 | HR 69 | Temp 97.9°F | Resp 14 | Ht 68.19 in | Wt 140.0 lb

## 2024-01-27 DIAGNOSIS — J410 Simple chronic bronchitis: Secondary | ICD-10-CM | POA: Diagnosis not present

## 2024-01-27 DIAGNOSIS — F419 Anxiety disorder, unspecified: Secondary | ICD-10-CM | POA: Diagnosis not present

## 2024-01-27 DIAGNOSIS — N400 Enlarged prostate without lower urinary tract symptoms: Secondary | ICD-10-CM | POA: Insufficient documentation

## 2024-01-27 DIAGNOSIS — Z1211 Encounter for screening for malignant neoplasm of colon: Secondary | ICD-10-CM

## 2024-01-27 DIAGNOSIS — N401 Enlarged prostate with lower urinary tract symptoms: Secondary | ICD-10-CM

## 2024-01-27 DIAGNOSIS — E440 Moderate protein-calorie malnutrition: Secondary | ICD-10-CM | POA: Diagnosis not present

## 2024-01-27 DIAGNOSIS — R3912 Poor urinary stream: Secondary | ICD-10-CM

## 2024-01-27 DIAGNOSIS — F17210 Nicotine dependence, cigarettes, uncomplicated: Secondary | ICD-10-CM

## 2024-01-27 DIAGNOSIS — Z23 Encounter for immunization: Secondary | ICD-10-CM

## 2024-01-27 DIAGNOSIS — F32A Depression, unspecified: Secondary | ICD-10-CM

## 2024-01-27 DIAGNOSIS — E782 Mixed hyperlipidemia: Secondary | ICD-10-CM | POA: Diagnosis not present

## 2024-01-27 MED ORDER — TAMSULOSIN HCL 0.4 MG PO CAPS: 0.4000 mg | ORAL_CAPSULE | Freq: Every day | ORAL | 3 refills | Status: AC

## 2024-01-27 NOTE — Assessment & Plan Note (Signed)
 Chronic, recent PSA reassuring. Will trial Flomax  0.4 MG every night. Educated him on this medication and possible side effects. Recommend he try taking every night for at least 6-8 weeks and see if benefit.

## 2024-01-27 NOTE — Progress Notes (Signed)
 BP 132/80 (BP Location: Left Arm, Patient Position: Sitting, Cuff Size: Normal)   Pulse 69   Temp 97.9 F (36.6 C) (Oral)   Resp 14   Ht 5' 8.19 (1.732 m)   Wt 140 lb (63.5 kg)   SpO2 95%   BMI 21.17 kg/m    Subjective:    Patient ID: Philip Rice, male    DOB: [birth year], 63 y.o.   MRN: [id number]  HPI: Philip Rice is a 63 y.o. male  Chief Complaint  Patient presents with   COPD    Same as always. Try to slow down smoking but still a pack a day.    Hyperlipidemia   Mood    Says he is too old to worry about that stuff.    BPH His prostate messes with him a little bit. Tries to drink a lot of water. Recent PSA within normal range. BPH status: uncontrolled Duration: chronic Nocturia: 1-2x per night Urinary frequency:no Incomplete voiding: sometimes Urgency: yes Weak urinary stream: occasional Straining to start stream: occasional Dysuria: no Onset: gradual Severity: mild Alleviating factors: nothing Aggravating factors: increased water intake Treatments attempted: none IPSS Questionnaire (AUA-7): 8 AUA Over the past month.   1)  How often have you had a sensation of not emptying your bladder completely after you finish urinating?  1 - Less than 1 time in 5  2)  How often have you had to urinate again less than two hours after you finished urinating? 1 - Less than 1 time in 5  3)  How often have you found you stopped and started again several times when you urinated?  1 - Less than 1 time in 5  4) How difficult have you found it to postpone urination?  1 - Less than 1 time in 5  5) How often have you had a weak urinary stream?  1 - Less than 1 time in 5  6) How often have you had to push or strain to begin urination?  1 - Less than 1 time in 5  7) How many times did you most typically get up to urinate from the time you went to bed until the time you got up in the morning?  2 - 2 times  Total score:  0-7 mildly symptomatic   8-19 moderately symptomatic    20-35 severely symptomatic     HYPERLIPIDEMIA Is not taking Rosuvastatin , states he was concerned because his had something in it that his brother's did not.  Statins make him feel bad. Hyperlipidemia status: good compliance Satisfied with current treatment?  no Side effects:  yes Medication compliance: poor compliance Supplements: none Aspirin:  no The 10-year ASCVD risk score (Arnett DK, et al., 2019) is: 15.4%   Values used to calculate the score:     Age: 63 years     Clincally relevant sex: Male     Is Non-Hispanic African American: No     Diabetic: No     Tobacco smoker: Yes     Systolic Blood Pressure: 132 mmHg     Is BP treated: No     HDL Cholesterol: 46 mg/dL     Total Cholesterol: 168 mg/dL Chest pain:  no Coronary artery disease:  no Family history CAD:  yes Family history early CAD:  no   COPD Follow-up today for lungs.  No current inhalers, has never used. 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  Got pneumonia.  Smokes 1/2 PPD on regular basis.  Used to be 1 PPD.  Was a kid when started smoking.  Did see ENT this year and had polyp removed from left nare. COPD status: stable Satisfied with current treatment?: yes Oxygen use: no Dyspnea frequency: occasional, not often Cough frequency: occasional Rescue inhaler frequency:  none Limitation of activity: no Productive cough: none Last Spirometry: 08/26/23 Pneumovax: Up To Date Influenza: refused   Denies any issues with mood.    01/27/2024    2:50 PM 07/29/2023   10:16 AM  Depression screen PHQ 2/9  Decreased Interest 3 2  Down, Depressed, Hopeless 0 2  PHQ - 2 Score 3 4  Altered sleeping 0 0  Tired, decreased energy 2 3  Change in appetite 2 0  Feeling bad or failure about yourself  0 0  Trouble concentrating 3 3  Moving slowly or fidgety/restless 3 0  Suicidal thoughts 0 0  PHQ-9 Score 13 10   Difficult doing work/chores Somewhat difficult  Somewhat difficult     Data saved with a previous flowsheet row definition       01/27/2024    2:50 PM 07/29/2023   10:16 AM  GAD 7 : Generalized Anxiety Score  Nervous, Anxious, on Edge 2 1  Control/stop worrying 0 3  Worry too much - different things 0 3  Trouble relaxing 0 3  Restless 3 3  Easily annoyed or irritable 2 3  Afraid - awful might happen 0 1  Total GAD 7 Score 7 17  Anxiety Difficulty  Somewhat difficult   Relevant past medical, surgical, family and social history reviewed and updated as indicated. Interim medical history since our last visit reviewed. Allergies and medications reviewed and updated.  Review of Systems  Constitutional:  Negative for activity change, diaphoresis, fatigue and fever.  Respiratory:  Negative for cough, chest tightness, shortness of breath and wheezing.   Cardiovascular:  Negative for chest pain, palpitations and leg swelling.  Gastrointestinal: Negative.   Neurological: Negative.   Psychiatric/Behavioral: Negative.      Per HPI unless specifically indicated above     Objective:    BP 132/80 (BP Location: Left Arm, Patient Position: Sitting, Cuff Size: Normal)   Pulse 69   Temp 97.9 F (36.6 C) (Oral)   Resp 14   Ht 5' 8.19 (1.732 m)   Wt 140 lb (63.5 kg)   SpO2 95%   BMI 21.17 kg/m   Wt Readings from Last 3 Encounters:  01/27/24 140 lb (63.5 kg)  08/26/23 143 lb 12.8 oz (65.2 kg)  07/29/23 146 lb 9.6 oz (66.5 kg)    Physical Exam Vitals and nursing note reviewed.  Constitutional:      General: He is awake. He is not in acute distress.    Appearance: He is well-developed, well-groomed and underweight. He is not ill-appearing or toxic-appearing.  HENT:     Head: Normocephalic.     Right Ear: Hearing and external ear normal.     Left Ear: Hearing and external ear normal.  Eyes:     General: Lids are normal.     Extraocular Movements: Extraocular movements intact.     Conjunctiva/sclera: Conjunctivae normal.   Neck:     Thyroid: No thyromegaly.     Vascular: No carotid bruit.  Cardiovascular:     Rate and Rhythm: Normal rate and regular rhythm.     Heart sounds: Normal heart sounds. No murmur heard.  No gallop.  Pulmonary:     Effort: No accessory muscle usage or respiratory distress.     Breath sounds: Normal breath sounds.  Abdominal:     General: Bowel sounds are normal. There is no distension.     Palpations: Abdomen is soft.     Tenderness: There is no abdominal tenderness.  Musculoskeletal:     Cervical back: Full passive range of motion without pain.     Right lower leg: No edema.     Left lower leg: No edema.  Lymphadenopathy:     Cervical: No cervical adenopathy.  Skin:    General: Skin is warm.     Capillary Refill: Capillary refill takes less than 2 seconds.  Neurological:     Mental Status: He is alert and oriented to person, place, and time.     Deep Tendon Reflexes: Reflexes are normal and symmetric.     Reflex Scores:      Brachioradialis reflexes are 2+ on the right side and 2+ on the left side.      Patellar reflexes are 2+ on the right side and 2+ on the left side. Psychiatric:        Attention and Perception: Attention normal.        Mood and Affect: Mood normal.        Speech: Speech normal.        Behavior: Behavior normal. Behavior is cooperative.        Thought Content: Thought content normal.     Results for orders placed or performed in visit on 07/29/23  CBC with Differential/Platelet   Collection Time: 07/29/23 10:50 AM  Result Value Ref Range   WBC 6.8 3.4 - 10.8 x10E3/uL   RBC 5.21 4.14 - 5.80 x10E6/uL   Hemoglobin 15.8 13.0 - 17.7 g/dL   Hematocrit 52.2 62.4 - 51.0 %   MCV 92 79 - 97 fL   MCH 30.3 26.6 - 33.0 pg   MCHC 33.1 31.5 - 35.7 g/dL   RDW 88.1 88.3 - 84.5 %   Platelets 353 150 - 450 x10E3/uL   Neutrophils 62 Not Estab. %   Lymphs 25 Not Estab. %   Monocytes 10 Not Estab. %   Eos 2 Not Estab. %   Basos 1 Not Estab. %    Neutrophils Absolute 4.2 1.4 - 7.0 x10E3/uL   Lymphocytes Absolute 1.7 0.7 - 3.1 x10E3/uL   Monocytes Absolute 0.7 0.1 - 0.9 x10E3/uL   EOS (ABSOLUTE) 0.2 0.0 - 0.4 x10E3/uL   Basophils Absolute 0.1 0.0 - 0.2 x10E3/uL   Immature Granulocytes 0 Not Estab. %   Immature Grans (Abs) 0.0 0.0 - 0.1 x10E3/uL  Comprehensive metabolic panel with GFR   Collection Time: 07/29/23 10:50 AM  Result Value Ref Range   Glucose 81 70 - 99 mg/dL   BUN 11 8 - 27 mg/dL   Creatinine, Ser 8.96 0.76 - 1.27 mg/dL   eGFR 82 >40 fO/fpw/8.26   BUN/Creatinine Ratio 11 10 - 24   Sodium 141 134 - 144 mmol/L   Potassium 4.9 3.5 - 5.2 mmol/L   Chloride 103 96 - 106 mmol/L   CO2 24 20 - 29 mmol/L   Calcium  9.5 8.6 - 10.2 mg/dL   Total Protein 6.9 6.0 - 8.5 g/dL   Albumin 4.6 3.9 - 4.9 g/dL   Globulin, Total 2.3 1.5 - 4.5 g/dL   Bilirubin Total 0.4 0.0 - 1.2 mg/dL   Alkaline Phosphatase 65 44 - 121 IU/L  AST 21 0 - 40 IU/L   ALT 25 0 - 44 IU/L  TSH   Collection Time: 07/29/23 10:50 AM  Result Value Ref Range   TSH 1.580 0.450 - 4.500 uIU/mL  PSA   Collection Time: 07/29/23 10:50 AM  Result Value Ref Range   Prostate Specific Ag, Serum 0.5 0.0 - 4.0 ng/mL  Lipid Panel w/o Chol/HDL Ratio   Collection Time: 07/29/23 10:50 AM  Result Value Ref Range   Cholesterol, Total 168 100 - 199 mg/dL   Triglycerides 51 0 - 149 mg/dL   HDL 46 >60 mg/dL   VLDL Cholesterol Cal 10 5 - 40 mg/dL   LDL Chol Calc (NIH) 887 (H) 0 - 99 mg/dL  Hepatitis C antibody   Collection Time: 07/29/23 10:50 AM  Result Value Ref Range   Hep C Virus Ab Non Reactive Non Reactive  HIV Antibody (routine testing w rflx)   Collection Time: 07/29/23 10:50 AM  Result Value Ref Range   HIV Screen 4th Generation wRfx Non Reactive Non Reactive      Assessment & Plan:   Problem List Items Addressed This Visit       Respiratory   Chronic obstructive pulmonary disease (HCC) - Primary   Chronic, ongoing.  Will be scheduling lung cancer  screening upcoming.  Recommend complete cessation of smoking, he is working on this.  Spirometry noted mild to moderate lung changes.  Uses Albuterol  inhaler as needed, he refuses maintenance inhaler.      Relevant Orders   Ambulatory Referral for Lung Cancer Scre     Genitourinary   BPH (benign prostatic hyperplasia)   Chronic, recent PSA reassuring. Will trial Flomax  0.4 MG every night. Educated him on this medication and possible side effects. Recommend he try taking every night for at least 6-8 weeks and see if benefit.      Relevant Medications   tamsulosin  (FLOMAX ) 0.4 MG CAPS capsule     Other   Nicotine  dependence, cigarettes, uncomplicated   I have recommended complete cessation of tobacco use. I have discussed various options available for assistance with tobacco cessation including over the counter methods (Nicotine  gum, patch and lozenges). We also discussed prescription options (Chantix, Nicotine  Inhaler / Nasal Spray). The patient is not interested in pursuing any prescription tobacco cessation options at this time.       Mixed hyperlipidemia   Chronic, ongoing.  He has not tolerate statins, even on weekly basis. Refuses to take, will continue to discuss. Possibly use Zetia.      Relevant Orders   Comprehensive metabolic panel with GFR   Lipid Panel w/o Chol/HDL Ratio   Malnutrition of moderate degree   Ongoing, lost 6 lbs since May.  Recommend he focus on supplement intake at home, Ensure or Premiere Protein three servings daily.  Check labs today and monitor weight closely. Highly recommend he schedule lung cancer screening and colonoscopy.      Anxiety and depression   Ongoing, more related to state of world and people.  Denies SI/HI.  Declines medications.  Will monitor closely and initiate medication as needed.      Other Visit Diagnoses       Pneumococcal vaccination given       PCV20 today in office, educated patient.     Colon cancer screening       GI  referral placed.   Relevant Orders   Ambulatory referral to Gastroenterology        Follow up plan:  Return in about 3 months (around 04/28/2024) for COPD, BPH, HLD.

## 2024-01-27 NOTE — Assessment & Plan Note (Signed)
 Chronic, ongoing.  He has not tolerate statins, even on weekly basis. Refuses to take, will continue to discuss. Possibly use Zetia.

## 2024-01-27 NOTE — Assessment & Plan Note (Signed)
 Chronic, ongoing.  Will be scheduling lung cancer screening upcoming.  Recommend complete cessation of smoking, he is working on this.  Spirometry noted mild to moderate lung changes.  Uses Albuterol  inhaler as needed, he refuses maintenance inhaler.

## 2024-01-27 NOTE — Assessment & Plan Note (Signed)
 Ongoing, more related to state of world and people.  Denies SI/HI.  Declines medications.  Will monitor closely and initiate medication as needed.

## 2024-01-27 NOTE — Assessment & Plan Note (Signed)
 I have recommended complete cessation of tobacco use. I have discussed various options available for assistance with tobacco cessation including over the counter methods (Nicotine gum, patch and lozenges). We also discussed prescription options (Chantix, Nicotine Inhaler / Nasal Spray). The patient is not interested in pursuing any prescription tobacco cessation options at this time.

## 2024-01-27 NOTE — Assessment & Plan Note (Addendum)
 Ongoing, lost 6 lbs since May.  Recommend he focus on supplement intake at home, Ensure or Premiere Protein three servings daily.  Check labs today and monitor weight closely. Highly recommend he schedule lung cancer screening and colonoscopy.

## 2024-01-28 ENCOUNTER — Ambulatory Visit: Payer: Self-pay | Admitting: Nurse Practitioner

## 2024-01-28 ENCOUNTER — Other Ambulatory Visit: Payer: Self-pay

## 2024-01-28 LAB — COMPREHENSIVE METABOLIC PANEL WITH GFR
ALT: 17 IU/L (ref 0–44)
AST: 19 IU/L (ref 0–40)
Albumin: 4.4 g/dL (ref 3.9–4.9)
Alkaline Phosphatase: 67 IU/L (ref 47–123)
BUN/Creatinine Ratio: 17 (ref 10–24)
BUN: 21 mg/dL (ref 8–27)
Bilirubin Total: 0.7 mg/dL (ref 0.0–1.2)
CO2: 25 mmol/L (ref 20–29)
Calcium: 9.9 mg/dL (ref 8.6–10.2)
Chloride: 102 mmol/L (ref 96–106)
Creatinine, Ser: 1.27 mg/dL (ref 0.76–1.27)
Globulin, Total: 2.7 g/dL (ref 1.5–4.5)
Glucose: 86 mg/dL (ref 70–99)
Potassium: 4.4 mmol/L (ref 3.5–5.2)
Sodium: 140 mmol/L (ref 134–144)
Total Protein: 7.1 g/dL (ref 6.0–8.5)
eGFR: 63 mL/min/1.73 (ref >59)

## 2024-01-28 LAB — LIPID PANEL W/O CHOL/HDL RATIO
Cholesterol, Total: 176 mg/dL (ref 100–199)
HDL: 41 mg/dL (ref >39)
LDL Chol Calc (NIH): 118 mg/dL — ABNORMAL HIGH (ref 0–99)
Triglycerides: 93 mg/dL (ref 0–149)
VLDL Cholesterol Cal: 17 mg/dL (ref 5–40)

## 2024-01-28 MED ORDER — ALBUTEROL SULFATE HFA 108 (90 BASE) MCG/ACT IN AERS: 2.0000 | INHALATION_SPRAY | Freq: Four times a day (QID) | RESPIRATORY_TRACT | 3 refills | Status: AC | PRN

## 2024-01-28 NOTE — Progress Notes (Signed)
 Good afternoon, please let Vaibhav know his labs have returned: - LDL, bad cholesterol, remains elevated. I do continue to recommend statin therapy. Please try taking the Rosuvastatin  weekly and we will recheck next visit. - Remainder of labs stable. Any questions? Keep being amazing!!  Thank you for allowing me to participate in your care.  I appreciate you. Kindest regards, Breezy Hertenstein

## 2024-02-04 ENCOUNTER — Telehealth: Payer: Self-pay

## 2024-02-04 DIAGNOSIS — Z87891 Personal history of nicotine dependence: Secondary | ICD-10-CM

## 2024-02-04 DIAGNOSIS — Z122 Encounter for screening for malignant neoplasm of respiratory organs: Secondary | ICD-10-CM

## 2024-02-04 DIAGNOSIS — F17210 Nicotine dependence, cigarettes, uncomplicated: Secondary | ICD-10-CM

## 2024-02-04 NOTE — Telephone Encounter (Signed)
 Lung Cancer Screening Narrative/Criteria Questionnaire (Cigarette Smokers Only- No Cigars/Pipes/vapes)   Philip Rice Pouch   SDMV:02/18/2024 at 11:15 [name]         63 y.o.                         LDCT: 02/24/2024 at 10:30 am [facility]   Phone: [phone]  Lung Screening Narrative (confirm age 32-77 yrs Medicare / 50-80 yrs Private pay insurance)   Insurance information:[employer] Medicare   Referring Provider: Valerio, NP   This screening involves an initial phone call with a team member from our program. It is called a shared decision making visit. The initial meeting is required by  insurance and Medicare to make sure you understand the program. This appointment takes about 15-20 minutes to complete. You will complete the screening scan at your scheduled date/time.  This scan takes about 5-10 minutes to complete. You can eat and drink normally before and after the scan.  Criteria questions for Lung Cancer Screening:   Are you a current or former smoker? Current Age began smoking: 8   If you are a former smoker, what year did you quit smoking?Quit 7 years and started back. (within 15 yrs)   To calculate your smoking history, I need an accurate estimate of how many packs of cigarettes you smoked per day and for how many years. (Not just the number of PPD you are now smoking)   Years smoking 48 x Packs per day 1 = Pack years 48   (at least 20 pack yrs)   (Make sure they understand that we need to know how much they have smoked in the past, not just the number of PPD they are smoking now)  Do you have a personal history of cancer?  No    Do you have a family history of cancer? Yes  (cancer type and and relative) Granfatghe rhasd cancer.  Are you coughing up blood?  No  Have you had unexplained weight loss of 15 lbs or more in the last 6 months? No  It looks like you meet all criteria.  When would be a good time for us  to schedule you for this screening?   Additional information: N/A

## 2024-02-06 ENCOUNTER — Telehealth: Payer: Self-pay | Admitting: Nurse Practitioner

## 2024-02-06 NOTE — Telephone Encounter (Signed)
 Returned call to patient. rosuvastatin  (CRESTOR ) 10 MG tablet is the medication he is concerned with. He says that is it causing him troubles with his legs below the knees. He is asking for something that would not cause this. The calcium  part he misunderstood and is no longer worried about.

## 2024-02-06 NOTE — Telephone Encounter (Signed)
 Copied from CRM #[id number]. Topic: Clinical - Medication Question >> Feb 06, 2024 12:59 PM Sophia H wrote: Reason for CRM: Patient has some questions regarding a medication he said was prescribed before that did not have calcium  in it - he was unsure of medication name. Please reach out to patient to clarify, thank you # [phone]

## 2024-02-09 MED ORDER — ROSUVASTATIN CALCIUM 10 MG PO TABS: 10.0000 mg | ORAL_TABLET | ORAL | 1 refills | Status: AC

## 2024-02-09 NOTE — Telephone Encounter (Signed)
 Patient aware and will try once weekly dosing and if problems redevelop he will let us  know.

## 2024-02-09 NOTE — Telephone Encounter (Signed)
 Patient states he actually stopped taking the statin a bit ago and threw the rest away. He has been taking something he bought at [employer]. He will need an rx sent to his pharmacy.

## 2024-02-18 ENCOUNTER — Encounter: Admitting: Adult Health

## 2024-02-24 ENCOUNTER — Ambulatory Visit: Admission: RE | Admit: 2024-02-24

## 2024-04-02 ENCOUNTER — Telehealth: Payer: Self-pay | Admitting: Nurse Practitioner

## 2024-04-02 NOTE — Telephone Encounter (Signed)
 Copied from CRM [id number]. Topic: General - Call Back - No Documentation >> Apr 02, 2024  1:18 PM Carlyon D wrote: Reason for CRM: Pt calling in regards to having a missed call I do not see any documentation that a call was made to pt. Please reach out to pt if called pt

## 2024-04-02 NOTE — Telephone Encounter (Signed)
 I do not see any notes and admin did not try to reach him.

## 2024-04-28 ENCOUNTER — Ambulatory Visit: Admitting: Nurse Practitioner
